# Patient Record
Sex: Male | Born: 1940 | Race: White | Hispanic: No | Marital: Married | Smoking: Never smoker
Health system: Southern US, Community
[De-identification: ages and names within clinical notes are randomized; demographics above are authoritative.]

## PROBLEM LIST (undated history)

## (undated) DIAGNOSIS — R011 Cardiac murmur, unspecified: Secondary | ICD-10-CM

## (undated) DIAGNOSIS — Z125 Encounter for screening for malignant neoplasm of prostate: Secondary | ICD-10-CM

## (undated) DIAGNOSIS — C801 Malignant (primary) neoplasm, unspecified: Secondary | ICD-10-CM

## (undated) DIAGNOSIS — D126 Benign neoplasm of colon, unspecified: Secondary | ICD-10-CM

## (undated) DIAGNOSIS — I712 Thoracic aortic aneurysm, without rupture: Secondary | ICD-10-CM

## (undated) DIAGNOSIS — I7121 Aneurysm of the ascending aorta, without rupture: Secondary | ICD-10-CM

## (undated) DIAGNOSIS — M67919 Unspecified disorder of synovium and tendon, unspecified shoulder: Secondary | ICD-10-CM

## (undated) DIAGNOSIS — M1990 Unspecified osteoarthritis, unspecified site: Secondary | ICD-10-CM

## (undated) DIAGNOSIS — R7401 Elevation of levels of liver transaminase levels: Secondary | ICD-10-CM

## (undated) DIAGNOSIS — R740 Nonspecific elevation of levels of transaminase and lactic acid dehydrogenase [LDH]: Secondary | ICD-10-CM

## (undated) DIAGNOSIS — E785 Hyperlipidemia, unspecified: Secondary | ICD-10-CM

## (undated) DIAGNOSIS — T8859XA Other complications of anesthesia, initial encounter: Secondary | ICD-10-CM

## (undated) DIAGNOSIS — I10 Essential (primary) hypertension: Secondary | ICD-10-CM

## (undated) DIAGNOSIS — J45909 Unspecified asthma, uncomplicated: Secondary | ICD-10-CM

## (undated) DIAGNOSIS — Z9889 Other specified postprocedural states: Secondary | ICD-10-CM

## (undated) DIAGNOSIS — T4145XA Adverse effect of unspecified anesthetic, initial encounter: Secondary | ICD-10-CM

## (undated) DIAGNOSIS — I351 Nonrheumatic aortic (valve) insufficiency: Secondary | ICD-10-CM

## (undated) DIAGNOSIS — Z860100 Personal history of colon polyps, unspecified: Secondary | ICD-10-CM

## (undated) DIAGNOSIS — R0609 Other forms of dyspnea: Secondary | ICD-10-CM

## (undated) DIAGNOSIS — R112 Nausea with vomiting, unspecified: Secondary | ICD-10-CM

## (undated) DIAGNOSIS — Z8719 Personal history of other diseases of the digestive system: Secondary | ICD-10-CM

## (undated) DIAGNOSIS — N529 Male erectile dysfunction, unspecified: Secondary | ICD-10-CM

## (undated) DIAGNOSIS — J019 Acute sinusitis, unspecified: Secondary | ICD-10-CM

## (undated) DIAGNOSIS — R0989 Other specified symptoms and signs involving the circulatory and respiratory systems: Secondary | ICD-10-CM

## (undated) DIAGNOSIS — E291 Testicular hypofunction: Secondary | ICD-10-CM

## (undated) DIAGNOSIS — R079 Chest pain, unspecified: Secondary | ICD-10-CM

## (undated) DIAGNOSIS — M719 Bursopathy, unspecified: Secondary | ICD-10-CM

## (undated) DIAGNOSIS — N400 Enlarged prostate without lower urinary tract symptoms: Secondary | ICD-10-CM

## (undated) HISTORY — DX: Chest pain, unspecified: R07.9

## (undated) HISTORY — DX: Personal history of colon polyps, unspecified: Z86.0100

## (undated) HISTORY — DX: Benign prostatic hyperplasia without lower urinary tract symptoms: N40.0

## (undated) HISTORY — DX: Acute sinusitis, unspecified: J01.90

## (undated) HISTORY — DX: Essential (primary) hypertension: I10

## (undated) HISTORY — PX: ORIF RADIAL SHAFT FRACTURE: SUR957

## (undated) HISTORY — DX: Benign neoplasm of colon, unspecified: D12.6

## (undated) HISTORY — DX: Thoracic aortic aneurysm, without rupture: I71.2

## (undated) HISTORY — DX: Other specified symptoms and signs involving the circulatory and respiratory systems: R09.89

## (undated) HISTORY — DX: Nonspecific elevation of levels of transaminase and lactic acid dehydrogenase (ldh): R74.0

## (undated) HISTORY — DX: Encounter for screening for malignant neoplasm of prostate: Z12.5

## (undated) HISTORY — DX: Unspecified asthma, uncomplicated: J45.909

## (undated) HISTORY — DX: Other forms of dyspnea: R06.09

## (undated) HISTORY — DX: Testicular hypofunction: E29.1

## (undated) HISTORY — PX: EYE SURGERY: SHX253

## (undated) HISTORY — DX: Bursopathy, unspecified: M71.9

## (undated) HISTORY — DX: Unspecified disorder of synovium and tendon, unspecified shoulder: M67.919

## (undated) HISTORY — DX: Hyperlipidemia, unspecified: E78.5

## (undated) HISTORY — DX: Male erectile dysfunction, unspecified: N52.9

## (undated) HISTORY — DX: Elevation of levels of liver transaminase levels: R74.01

## (undated) HISTORY — DX: Aneurysm of the ascending aorta, without rupture: I71.21

## (undated) HISTORY — DX: Nonrheumatic aortic (valve) insufficiency: I35.1

## (undated) HISTORY — DX: Personal history of colonic polyps: Z86.010

---

## 2002-03-20 ENCOUNTER — Ambulatory Visit (HOSPITAL_COMMUNITY): Admission: RE | Admit: 2002-03-20 | Discharge: 2002-03-20 | Payer: Self-pay | Admitting: *Deleted

## 2002-03-20 ENCOUNTER — Encounter: Payer: Self-pay | Admitting: *Deleted

## 2011-10-28 ENCOUNTER — Encounter: Payer: Self-pay | Admitting: Cardiothoracic Surgery

## 2011-10-28 ENCOUNTER — Institutional Professional Consult (permissible substitution) (INDEPENDENT_AMBULATORY_CARE_PROVIDER_SITE_OTHER): Payer: Self-pay | Admitting: Thoracic Surgery (Cardiothoracic Vascular Surgery)

## 2011-10-28 DIAGNOSIS — I359 Nonrheumatic aortic valve disorder, unspecified: Secondary | ICD-10-CM

## 2011-10-28 DIAGNOSIS — S46219A Strain of muscle, fascia and tendon of other parts of biceps, unspecified arm, initial encounter: Secondary | ICD-10-CM | POA: Insufficient documentation

## 2011-10-28 DIAGNOSIS — I351 Nonrheumatic aortic (valve) insufficiency: Secondary | ICD-10-CM

## 2011-10-28 DIAGNOSIS — I712 Thoracic aortic aneurysm, without rupture: Secondary | ICD-10-CM

## 2011-10-28 DIAGNOSIS — D126 Benign neoplasm of colon, unspecified: Secondary | ICD-10-CM

## 2011-10-28 DIAGNOSIS — K635 Polyp of colon: Secondary | ICD-10-CM | POA: Insufficient documentation

## 2011-10-28 DIAGNOSIS — S43499A Other sprain of unspecified shoulder joint, initial encounter: Secondary | ICD-10-CM

## 2011-10-28 DIAGNOSIS — N400 Enlarged prostate without lower urinary tract symptoms: Secondary | ICD-10-CM | POA: Insufficient documentation

## 2011-10-28 DIAGNOSIS — J45909 Unspecified asthma, uncomplicated: Secondary | ICD-10-CM | POA: Insufficient documentation

## 2011-10-28 DIAGNOSIS — E785 Hyperlipidemia, unspecified: Secondary | ICD-10-CM

## 2011-10-28 NOTE — Progress Notes (Signed)
PCP is No primary provider on file. Referring Provider is No ref. provider found  No chief complaint on file.   HPI: 71 yo WM in overall good health presents with cc/o aortic aneurysm. He first noted SOB while climbing ~ 40 steps on a hot day in summer of 2011. Went to MD. W/u included stress test, echocardiogram and Ct of chest. Found to have 4.8 x 4.6 cm ascending aortic aneurysm and mild to moderate AI. Was seen in consultation by Dr. Tereso Newcomer. Recommended f/u echo, which was done in 12/12. It showed no significant change with size estimated at 4.9 cm and mild to moderate AI. He is essentially asymptomatic, only getting SOB when walking up multiple fights of stairs. He can walk 25- 30 minutes on treadmill and lift weights without problems. He is very anxious about the aneurysm.   Past Medical History  Diagnosis Date  . Acute sinusitis, unspecified   . Abdominal aneurysm without mention of rupture     4.8cm x 4.6cm  . Personal history of colonic polyps     x1-has surveillance colonoscopies.Complete Colonoscopy;with polypectomy [phone]; has surveillance colonoscopies.   . Aortic valve disorders   . Unspecified asthma   . Benign neoplasm of colon   . 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 Chest pain, unspecified   . Snoring disorder   . Hypertension   . Hyperlipidemia   . Other testicular hypofunction   . Impotence of organic origin   . Nonspecific elevation of levels of transaminase or lactic acid dehydrogenase (LDH)   . Disorders of bursae and tendons in shoulder region, unspecified   . Special screening for malignant neoplasm of prostate     Past Surgical History  Procedure Date  . Orif radial shaft fracture     Clo Treat of Fracture of Radial Shaft, Distal Dislocation; bilateral-age 71 years    No family history on file.- negative for aneurysm  Social History History  Substance Use Topics  . Smoking status:  Never Smoker   . Smokeless tobacco: Not on file  . Alcohol Use: Yes     ocassional wine and beer    Current Outpatient Prescriptions  Medication Sig Dispense Refill  . albuterol (PROVENTIL,VENTOLIN) 90 MCG/ACT inhaler Inhale 1-2 puffs into the lungs every 4 (four) hours as needed.      Marland Kitchen aspirin 81 MG tablet Take 81 mg by mouth daily.      . Cholecalciferol (VITAMIN D PO) Take 400 Units by mouth daily.      . CYCLOBENZAPRINE HCL PO 10mg  tablet. Take 1/2 to 1 tablet at bedtime as needed for neck/shoulder pain      . ERTACZO 2 % CREA       . fenofibrate micronized (LOFIBRA) 67 MG capsule Take 67 mg by mouth daily.      Marland Kitchen LOVASTATIN PO Take 20 mg by mouth daily.      . Multiple Vitamin (MULTIVITAMIN) capsule Take 1 capsule by mouth daily.      . Multiple Vitamins-Minerals (ZINC PO) Take 1 tablet by mouth daily.      . Tamsulosin HCl (FLOMAX) 0.4 MG CAPS Take 0.4 mg by mouth at bedtime.        Allergies  Allergen Reactions  . Demerol     NAUSEA AND VOMITTING    Review of Systems  Constitutional: Negative for fever, chills, activity change, appetite change and unexpected weight change.  HENT: Negative.   Respiratory:  Positive for shortness of breath (when walking up > 2 flights of stairs) and wheezing (occasionally). Negative for apnea, cough and chest tightness.   Cardiovascular: Negative for chest pain and leg swelling.       No orthopnea or PND  Gastrointestinal: Negative.   Genitourinary: Positive for urgency and frequency.  Musculoskeletal:       Chronic torn right bicep  Skin: Negative.   Neurological: Negative for dizziness, syncope and light-headedness.  Hematological: Negative.   Psychiatric/Behavioral: Negative.   All other systems reviewed and are negative.    There were no vitals taken for this visit. Physical Exam  Constitutional: He is oriented to person, place, and time. He appears well-developed and well-nourished. No distress.  HENT:  Head: Normocephalic  and atraumatic.  Eyes: EOM are normal. Pupils are equal, round, and reactive to light.  Neck: Neck supple. No JVD present. No thyromegaly present.  Cardiovascular: Normal rate, regular rhythm and intact distal pulses.  Exam reveals no gallop and no friction rub.   Murmur (2/6 diastolic murmur) heard. Pulmonary/Chest: Effort normal. He has no wheezes. He has no rales.  Abdominal: Soft. There is no tenderness.  Musculoskeletal: Normal range of motion. He exhibits no edema.  Lymphadenopathy:    He has no cervical adenopathy.  Neurological: He is alert and oriented to person, place, and time.       No focal deficits  Skin: Skin is warm and dry.  Psychiatric: He has a normal mood and affect.     Diagnostic Tests: Echo reviewed.  Impression: 71 yo WM with a 4.8- 4.9 cm ascending aortic aneurysm with mild to moderate AI. It does not appear to have increased in size significantly since 8/11, but only recent measurement is by echo, not CT or MR. I had a long discussion with the patient and his daughter regarding the diagnosis, natural history of ascending aortic aneurysms and indications for treatment. They are aware of risks of dissection and rupture. They are aware that general recommendation for surgery for aneurysms at this location is 5.5 -6 cm or growth greater than 5 mm in 6 months.   He initially was very anxious to proceed with surgery, but following our conversation his anxiety about the aneurysm was improved.  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 Outside of heavy weight lifting he does not need to limit his activities at all.  Plan: Since it has been > 1 year since his last CT and there is some chance of small errors with echo measurements, I recommended we repeat a chest CT angio. I will see him back next week to discuss the results.

## 2011-11-04 ENCOUNTER — Ambulatory Visit (INDEPENDENT_AMBULATORY_CARE_PROVIDER_SITE_OTHER): Payer: BLUE CROSS/BLUE SHIELD | Admitting: Thoracic Surgery (Cardiothoracic Vascular Surgery)

## 2011-11-04 DIAGNOSIS — I712 Thoracic aortic aneurysm, without rupture: Secondary | ICD-10-CM

## 2011-11-04 NOTE — Progress Notes (Signed)
Patient ID: Levi Shepard, male   DOB: 07/01/1941, 71 y.o.   MRN: [id number]  Met with Levi Shepard today to review CT angio from 10/28/11 and compare to 05/13/10.  I reviewed the films in detail with him on the monitor, showing him measurements from coronal, sagittal and cross sectional views.  The radiologist officially read this as being increased in size from 4.8 x 5.1 to 4.8 by 5.6 or an increase of 5 mm in Ap dimension with no change in transverse dimension. With repeated measurements in all 3 views, which both studies had in common, I find there is no increase in the true diameter of the aneurysm, which measures 4.8 cm- 4.9 cm in both studies. It does appear the aneurysm may have elongated to some degree from the prior study with a slightly more pronounced C- shape to the ascending aorta, which may have contributed to the measurement obtained in the multiplanar view.  My recommendation to Levi Shepard at this time is to repeat imaging in 6 months with an MR angio. I do not think there is evidence of increased diameter sufficient to recommend aortic root replacement at this time.

## 2013-11-02 ENCOUNTER — Encounter (HOSPITAL_BASED_OUTPATIENT_CLINIC_OR_DEPARTMENT_OTHER): Payer: Self-pay | Admitting: Emergency Medicine

## 2013-11-02 ENCOUNTER — Emergency Department (HOSPITAL_BASED_OUTPATIENT_CLINIC_OR_DEPARTMENT_OTHER)
Admission: EM | Admit: 2013-11-02 | Discharge: 2013-11-02 | Disposition: A | Discharge status: Home / Self Care | Payer: Medicare Other | Attending: Emergency Medicine | Admitting: Emergency Medicine

## 2013-11-02 DIAGNOSIS — R05 Cough: Secondary | ICD-10-CM | POA: Insufficient documentation

## 2013-11-02 DIAGNOSIS — J45909 Unspecified asthma, uncomplicated: Secondary | ICD-10-CM | POA: Insufficient documentation

## 2013-11-02 DIAGNOSIS — E785 Hyperlipidemia, unspecified: Secondary | ICD-10-CM | POA: Insufficient documentation

## 2013-11-02 DIAGNOSIS — Z860100 Personal history of colon polyps, unspecified: Secondary | ICD-10-CM | POA: Insufficient documentation

## 2013-11-02 DIAGNOSIS — R42 Dizziness and giddiness: Secondary | ICD-10-CM | POA: Insufficient documentation

## 2013-11-02 DIAGNOSIS — Z7982 Long term (current) use of aspirin: Secondary | ICD-10-CM | POA: Insufficient documentation

## 2013-11-02 DIAGNOSIS — Z8546 Personal history of malignant neoplasm of prostate: Secondary | ICD-10-CM | POA: Insufficient documentation

## 2013-11-02 DIAGNOSIS — I10 Essential (primary) hypertension: Secondary | ICD-10-CM | POA: Insufficient documentation

## 2013-11-02 DIAGNOSIS — Z79899 Other long term (current) drug therapy: Secondary | ICD-10-CM | POA: Insufficient documentation

## 2013-11-02 DIAGNOSIS — N400 Enlarged prostate without lower urinary tract symptoms: Secondary | ICD-10-CM | POA: Insufficient documentation

## 2013-11-02 DIAGNOSIS — R059 Cough, unspecified: Secondary | ICD-10-CM | POA: Insufficient documentation

## 2013-11-02 DIAGNOSIS — R040 Epistaxis: Secondary | ICD-10-CM | POA: Diagnosis present

## 2013-11-02 MED ORDER — OXYMETAZOLINE HCL 0.05 % NA SOLN
1.0000 | Freq: Once | NASAL | Status: AC
  Administered 2013-11-02: 1 via NASAL
  Filled 2013-11-02: qty 15

## 2013-11-02 NOTE — ED Notes (Addendum)
Nosebleed tonight. He been having nosebleeds on and off for the past couple of days. Daughter is with him and states he had so much bleeding tonight he was coughing up blood. Bleeding has slowed on arrival to triage.

## 2013-11-02 NOTE — Discharge Instructions (Signed)

## 2013-11-02 NOTE — ED Provider Notes (Signed)
CSN: [id number]     Arrival date & time 11/02/13  1941 History  This chart was scribed for [name], MD by Maree Erie, ED Scribe. The patient was seen in room MH01/MH01. Patient's care was started at 9:07 PM.      Chief Complaint  Patient presents with  . Epistaxis    Patient is a 73 y.o. male presenting with nosebleeds. The history is provided by the patient. No language interpreter was used.  Epistaxis Location:  L nare Duration:  3 days Timing:  Intermittent Progression:  Worsening Chronicity:  New Context: aspirin use   Relieved by:  Applying pressure Associated symptoms: no cough, no dizziness and no fever     HPI Comments: Levi Shepard is a 73 y.o. male who presents to the Emergency Department complaining of intermittent epistaxis from his left nostril that began two days ago while he was in the shower. He states that he has been able to stop the bleeding with pressure except for tonight. The last episode of bleeding began two and a half hours ago and finally subsided upon arriving to the exam room in the ED. He states that it was bleeding so profusely that it was filling up his mouth and he was coughing out clots. He reports feeling mildly weak and dizzy while the bleeding was occuring that has since subsided. He denies a history of epistaxis that has been this severe before. He takes an 81 mg Aspirin everyday and reports taking it during the days he was having epistaxis. His daughter states he was recently placed on a beta blocker for a diagnosed 5 cm aortic aneurysm.    Past Medical History  Diagnosis Date  . Acute sinusitis, unspecified   . Abdominal aneurysm without mention of rupture     4.8cm x 4.6cm  . Personal history of colonic polyps     x1-has surveillance colonoscopies.Complete Colonoscopy;with polypectomy [phone]; has surveillance colonoscopies.   . Aortic valve disorders   . Unspecified asthma(493.90)   . Benign neoplasm of colon   . Hypertrophy  of prostate without urinary obstruction and other lower urinary tract symptoms (LUTS)   . Chest pain, unspecified   . Other dyspnea and respiratory abnormality   . Hypertension   . Hyperlipidemia   . Other testicular hypofunction   . Impotence of organic origin   . Nonspecific elevation of levels of transaminase or lactic acid dehydrogenase (LDH)   . Disorders of bursae and tendons in shoulder region, unspecified   . Special screening for malignant neoplasm of prostate    Past Surgical History  Procedure Laterality Date  . Orif radial shaft fracture      Clo Treat of Fracture of Radial Shaft, Distal Dislocation; bilateral-age 23 years   No family history on file. History  Substance Use Topics  . Smoking status: Never Smoker   . Smokeless tobacco: Not on file  . Alcohol Use: Yes     Comment: ocassional wine and beer    Review of Systems  Constitutional: Negative for fever.  HENT: Positive for nosebleeds. Negative for drooling.   Eyes: Negative for discharge.  Respiratory: Negative for cough.   Cardiovascular: Negative for leg swelling.  Gastrointestinal: Negative for vomiting.  Endocrine: Negative for polyuria.  Genitourinary: Negative for hematuria.  Musculoskeletal: Negative for gait problem.  Skin: Negative for rash.  Allergic/Immunologic: Negative for immunocompromised state.  Neurological: Negative for dizziness, speech difficulty and weakness.  Hematological: Negative for adenopathy.  Psychiatric/Behavioral: Negative for confusion.  All other systems reviewed and are negative.    Allergies  Demerol  Home Medications   Current Outpatient Rx  Name  Route  Sig  Dispense  Refill  . albuterol (PROVENTIL,VENTOLIN) 90 MCG/ACT inhaler   Inhalation   Inhale 1-2 puffs into the lungs every 4 (four) hours as needed.         Marland Kitchen aspirin 81 MG tablet   Oral   Take 81 mg by mouth daily.         . Cholecalciferol (VITAMIN D PO)   Oral   Take 400 Units by mouth  daily.         . CYCLOBENZAPRINE HCL PO      10mg  tablet. Take 1/2 to 1 tablet at bedtime as needed for neck/shoulder pain         . ERTACZO 2 % CREA               . fenofibrate micronized (LOFIBRA) 67 MG capsule   Oral   Take 67 mg by mouth daily.         Marland Kitchen LOVASTATIN PO   Oral   Take 20 mg by mouth daily.         . Multiple Vitamin (MULTIVITAMIN) capsule   Oral   Take 1 capsule by mouth daily.         . Multiple Vitamins-Minerals (ZINC PO)   Oral   Take 1 tablet by mouth daily.         . Tamsulosin HCl (FLOMAX) 0.4 MG CAPS   Oral   Take 0.4 mg by mouth at bedtime.          Triage Vitals: BP 122/71  Pulse 72  Temp(Src) 97.4 F (36.3 C) (Tympanic)  Resp 16  Ht 6' (1.829 m)  Wt 178 lb (80.74 kg)  BMI 24.14 kg/m2  SpO2 96%  Physical Exam  Nursing note and vitals reviewed. Constitutional: He is oriented to person, place, and time. He appears well-developed and well-nourished. No distress.  HENT:  Head: Normocephalic and atraumatic.  Right Ear: External ear normal.  Left Ear: External ear normal.  Mouth/Throat: Oropharynx is clear and moist. No oropharyngeal exudate.  Left anterior nare, medial septal wall there is a small hemostatic ulceration.   Eyes: Conjunctivae and EOM are normal. Pupils are equal, round, and reactive to light.  Neck: Neck supple. No tracheal deviation present.  Cardiovascular: Normal rate, regular rhythm and normal heart sounds.  Exam reveals no gallop and no friction rub.   No murmur heard. Pulmonary/Chest: Effort normal and breath sounds normal. No respiratory distress. He has no wheezes. He has no rales.  Abdominal: Soft. He exhibits no distension. There is no tenderness. There is no rebound and no guarding.  Musculoskeletal: Normal range of motion.  Neurological: He is alert and oriented to person, place, and time.  Skin: Skin is warm and dry.  Psychiatric: He has a normal mood and affect. His behavior is normal.     ED Course  Procedures (including critical care time)  DIAGNOSTIC STUDIES: Oxygen Saturation is 96% on room air, adequate by my interpretation.    COORDINATION OF CARE: 9:15 PM - Patient verbalizes understanding and agrees with treatment plan.    Labs Review Labs Reviewed - No data to display Imaging Review No results found.  EKG Interpretation   None       MDM   1. Anterior epistaxis    73 y.o. here w/ several episodes of epistaxis in  last few days. Currently hemostatic. 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 Will place bacitracin now and provide afrin. Gave instructions for recurrent bleeds. I offered screening cbc, but pt insists he feels fine now and declines.    I have discussed the diagnosis/risks/treatment options with the patient and believe the pt to be eligible for discharge home to follow-up with ENT if bleeding continues. We also discussed returning to the ED immediately if new or worsening sx occur. We discussed the sx which are most concerning (e.g., uncontrolled epistaxis, dizziness, sob, weakness) that necessitate immediate return. Medications administered to the patient during their visit and any new prescriptions provided to the patient are listed below.  Medications given during this visit Medications  oxymetazoline (AFRIN) 0.05 % nasal spray 1 spray (1 spray Each Nare Given 11/02/13 2131)    Discharge Medication List as of 11/02/2013  9:31 PM        I personally performed the services described in this documentation, which was scribed in my presence. The recorded information has been reviewed and is accurate.    [name], MD 11/03/13 [phone]

## 2014-12-19 DIAGNOSIS — M4317 Spondylolisthesis, lumbosacral region: Secondary | ICD-10-CM | POA: Insufficient documentation

## 2014-12-19 DIAGNOSIS — M545 Low back pain, unspecified: Secondary | ICD-10-CM | POA: Insufficient documentation

## 2014-12-19 DIAGNOSIS — M47817 Spondylosis without myelopathy or radiculopathy, lumbosacral region: Secondary | ICD-10-CM | POA: Insufficient documentation

## 2015-03-13 ENCOUNTER — Institutional Professional Consult (permissible substitution) (INDEPENDENT_AMBULATORY_CARE_PROVIDER_SITE_OTHER): Payer: Medicare Other | Admitting: Thoracic Surgery (Cardiothoracic Vascular Surgery)

## 2015-03-13 ENCOUNTER — Other Ambulatory Visit: Payer: Self-pay | Admitting: *Deleted

## 2015-03-13 ENCOUNTER — Encounter: Payer: Self-pay | Admitting: Thoracic Surgery (Cardiothoracic Vascular Surgery)

## 2015-03-13 VITALS — BP 139/80 | HR 77 | Resp 20 | Ht 72.0 in | Wt 188.0 lb

## 2015-03-13 DIAGNOSIS — I351 Nonrheumatic aortic (valve) insufficiency: Secondary | ICD-10-CM | POA: Diagnosis not present

## 2015-03-13 DIAGNOSIS — I712 Thoracic aortic aneurysm, without rupture: Secondary | ICD-10-CM

## 2015-03-13 DIAGNOSIS — I7121 Aneurysm of the ascending aorta, without rupture: Secondary | ICD-10-CM

## 2015-03-13 DIAGNOSIS — I10 Essential (primary) hypertension: Secondary | ICD-10-CM

## 2015-03-13 NOTE — Progress Notes (Signed)
[address]       [place] [address]             [phone]       HPI:  Mr. Baughman comes to the office today to reestablish management of his ascending aortic aneurysm with me.  This aneurysm was first noted in 2011. He experienced SOB while climbing ~ 40 steps on a hot day that summer. He had a work up which included a stress test, echocardiogram and CT of the chest. He was found to have 4.8 x 4.6 cm ascending aortic aneurysm and mild to moderate AI. He saw Dr. Lewayne Bunting, who recommended a f/u echo, which was done in December 2012. There was no significant change with size estimated at 4.9 cm and mild to moderate AI.  I saw him in January of 2013 and recommended an MR in 6 months. The MR showed no significant change. After that he was followed by [facility], but says he never saw the same MD twice. He had a CT angiogram in August of 2015. He thinks he had an echo last year as well.   He has now decided to move his care re: the aneurysm to [facility]  He remains active and only gets SOB with heavy exertion. He can walk a mile at a steady pace without issues. He denies chest pain, pressure or tightness. He has recently noted several episodes of paroxysmal nocturnal dyspnea. He denies orthopnea and peripheral edema.  Past Medical History  Diagnosis Date  . Acute sinusitis, unspecified   . Ascending aortic aneurysm     4.8cm x 4.6cm in 2013  . Personal history of colonic polyps     x1-has surveillance colonoscopies.Complete Colonoscopy;with polypectomy [phone]; has surveillance colonoscopies.   . Aortic valve insufficiency   . Unspecified asthma(493.90)   . Benign neoplasm of colon   . 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 Chest pain, unspecified   . Other dyspnea and respiratory abnormality   . Hypertension   . Hyperlipidemia   . Other testicular hypofunction   . Impotence of  organic origin   . Nonspecific elevation of levels of transaminase or lactic acid dehydrogenase (LDH)   . Disorders of bursae and tendons in shoulder region, unspecified   . Special screening for malignant neoplasm of prostate       Current Outpatient Prescriptions  Medication Sig Dispense Refill  . albuterol (PROVENTIL,VENTOLIN) 90 MCG/ACT inhaler Inhale 1-2 puffs into the lungs every 4 (four) hours as needed.    . Cholecalciferol (VITAMIN D PO) Take 400 Units by mouth daily.    . CYCLOBENZAPRINE HCL PO 10mg  tablet. Take 1/2 to 1 tablet at bedtime as needed for neck/shoulder pain    . ERTACZO 2 % CREA daily as needed.     . fenofibrate micronized (LOFIBRA) 67 MG capsule Take 67 mg by mouth daily.    Marland Kitchen LOVASTATIN PO Take 20 mg by mouth daily.    . Multiple Vitamin (MULTIVITAMIN) capsule Take 1 capsule by mouth daily.    . Multiple Vitamins-Minerals (ZINC PO) Take 1 tablet by mouth daily.    . Tamsulosin HCl (FLOMAX) 0.4 MG CAPS Take 0.4 mg by mouth at bedtime.     No current facility-administered medications for this visit.    Physical Exam BP 139/80 mmHg  Pulse 77  Resp 20  Ht 6' (1.829 m)  Wt 188 lb (85.276 kg)  BMI 25.49 kg/m2  SpO2 97%  74 man in NAD Well developed and well nourished HEENT- [place]/AT, PERRLA, EOMI Neuro- A and O x 3, motor and sensory intact Neck- supple, no thyromegaly or adenopathy. No carotid bruits Cardiac: RRR, 3/6 diastolic murmur with early systolic component Lungs- clear with equal BS bilaterally, no rales or wheezes Abdomen- soft, NT/ND Extremities- no clubbing, cyanosis or edema Vascular- 3+ radial, DP and PT pulses equal bilaterally. No popliteal aneurysms  Diagnostic Tests: I personally reviewed his CT scans from 2011, 2013 and August 2015. He has a large aneurysm involving the aortic root and ascending aorta. Maximum size is 5.2 cm  Impression:  74 yo man with a 5.2 cm (as of 05/2014) ascending aortic aneurysm and known mild- moderate  AI.  He is not having any chest pain or exertional dyspnea, but has recently started having PND. He has no other signs or symptoms of heart failure.  Given the size of the aneurysm I think he needs a CT every 6 months. It has been almost 10 months since his last scan so I will order a new one and plan to see him back next week.  He is having some PND. He has known AI and his murmur is relatively loud on exam today. I will get a copy of the report from his most recent echo. If > 6 months old, he will need a repeat of that as well.  He knows to call 911 if he experiences chest pain, pressure or tightness.  Plan:  CT angiogram Echo report Follow up in one week to discuss surgery v continued conservative management  Melrose Nakayama, MD [facility] [phone]

## 2015-03-20 ENCOUNTER — Ambulatory Visit
Admission: RE | Admit: 2015-03-20 | Discharge: 2015-03-20 | Disposition: A | Discharge status: Home / Self Care | Payer: Medicare Other | Source: Ambulatory Visit | Attending: Thoracic Surgery (Cardiothoracic Vascular Surgery) | Admitting: Thoracic Surgery (Cardiothoracic Vascular Surgery)

## 2015-03-20 DIAGNOSIS — I712 Thoracic aortic aneurysm, without rupture: Secondary | ICD-10-CM

## 2015-03-20 DIAGNOSIS — I7121 Aneurysm of the ascending aorta, without rupture: Secondary | ICD-10-CM

## 2015-03-20 LAB — BUN: BUN: 27 mg/dL — ABNORMAL HIGH (ref 6–23)

## 2015-03-20 LAB — CREATININE, SERUM: Creat: 1.38 mg/dL — ABNORMAL HIGH (ref 0.50–1.35)

## 2015-03-20 MED ORDER — IOPAMIDOL (ISOVUE-370) INJECTION 76%
75.0000 mL | Freq: Once | INTRAVENOUS | Status: AC | PRN
  Administered 2015-03-20: 75 mL via INTRAVENOUS

## 2015-03-26 ENCOUNTER — Encounter: Payer: Self-pay | Admitting: Thoracic Surgery (Cardiothoracic Vascular Surgery)

## 2015-03-26 ENCOUNTER — Ambulatory Visit (INDEPENDENT_AMBULATORY_CARE_PROVIDER_SITE_OTHER): Payer: Medicare Other | Admitting: Thoracic Surgery (Cardiothoracic Vascular Surgery)

## 2015-03-26 VITALS — BP 150/76 | HR 72 | Resp 20 | Ht 72.0 in | Wt 191.0 lb

## 2015-03-26 DIAGNOSIS — I7121 Aneurysm of the ascending aorta, without rupture: Secondary | ICD-10-CM

## 2015-03-26 DIAGNOSIS — I712 Thoracic aortic aneurysm, without rupture: Secondary | ICD-10-CM | POA: Diagnosis not present

## 2015-03-26 NOTE — Progress Notes (Signed)
[address]       [place] [address]             [phone]       HPI:  Mr. Chenault returns today for follow-up after having a repeat CT angiogram of the chest.  He is a 74 year old gentleman who was first found to have an a ascending and aortic root aneurysm in 2011. The aneurysm was 4.8 x 4.6 cm and he had mild to moderate AI as well.  I saw him in January of 2013 and recommended an MR in 6 months. The MR showed no significant change. After that he was followed by [facility], but says he never saw the same MD twice. He had a CT angiogram in August of 2015. He decided to come to [facility] for follow-up of the aneurysm.  I saw him last week and recommended that we repeat his CT angiogram as it had been nearly a year since his most recent study. He had that done last week and now returns to discuss the results.  He remains active and only gets SOB with heavy exertion. He can walk a mile at a steady pace without issues. He denies chest pain, pressure or tightness. He has recently noted several episodes of paroxysmal nocturnal dyspnea. He denies orthopnea and peripheral edema.  Past Medical History  Diagnosis Date  . Acute sinusitis, unspecified   . Ascending aortic aneurysm     4.8cm x 4.6cm in 2013  . Personal history of colonic polyps     x1-has surveillance colonoscopies.Complete Colonoscopy;with polypectomy [phone]; has surveillance colonoscopies.   . Aortic valve insufficiency   . Unspecified asthma(493.90)   . Benign neoplasm of colon   . 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 Chest pain, unspecified   . Other dyspnea and respiratory abnormality   . Hypertension   . Hyperlipidemia   . Other testicular hypofunction   . Impotence of organic origin   . Nonspecific elevation of levels of transaminase or lactic acid dehydrogenase (LDH)   . Disorders of bursae and tendons in  shoulder region, unspecified   . Special screening for malignant neoplasm of prostate     Past Surgical History  Procedure Laterality Date  . Orif radial shaft fracture      Clo Treat of Fracture of Radial Shaft, Distal Dislocation; bilateral-age 63 years    Current Outpatient Prescriptions  Medication Sig Dispense Refill  . albuterol (PROVENTIL,VENTOLIN) 90 MCG/ACT inhaler Inhale 1-2 puffs into the lungs every 4 (four) hours as needed.    . Cholecalciferol (VITAMIN D PO) Take 400 Units by mouth daily.    . CYCLOBENZAPRINE HCL PO 10mg  tablet. Take 1/2 to 1 tablet at bedtime as needed for neck/shoulder pain    . ERTACZO 2 % CREA daily as needed.     . fenofibrate micronized (LOFIBRA) 67 MG capsule Take 67 mg by mouth daily.    Marland Kitchen LOVASTATIN PO Take 20 mg by mouth daily.    . Multiple Vitamin (MULTIVITAMIN) capsule Take 1 capsule by mouth daily.    . Multiple Vitamins-Minerals (ZINC PO) Take 1 tablet by mouth daily.    . Tamsulosin HCl (FLOMAX) 0.4 MG CAPS Take 0.4 mg by mouth at bedtime.     No current facility-administered medications for this visit.    Physical Exam BP 150/76 mmHg  Pulse 72  Resp 20  Ht 6' (1.829 m)  Wt 191 lb (86.637 kg)  BMI 25.90 kg/m2  SpO63 87% 74 year old man in no acute distress Well-developed and well-nourished HEENT unremarkable Alert and oriented 3 with no focal neurologic deficits No carotid bruits Cardiac regular rate and rhythm normal S1 and S2 with a diastolic murmur Lungs clear with equal breath sounds bilaterally Abdomen soft and nontender Extremities 2+ pulses throughout bilaterally, no edema  Diagnostic Tests: CT ANGIOGRAPHY CHEST WITH CONTRAST  TECHNIQUE: Multidetector CT imaging of the chest was performed using the standard protocol during bolus administration of intravenous contrast. Multiplanar CT image reconstructions and MIPs were obtained to evaluate the vascular anatomy.  CONTRAST: 75 mL of Isovue 370  intravenously.  COMPARISON: CT scan of May 23, 2014.  FINDINGS: No pneumothorax or pleural effusion is noted. Coronary artery calcifications are again noted. Stable cardiomegaly is noted. Stable left renal cyst is noted ; otherwise visualized portion of upper abdomen appears normal. There is no evidence of thoracic aortic dissection. Great vessels are widely patent without significant stenosis.  Sino-tubular junction of ascending thoracic aorta measures 5.2 cm in diameter which is unchanged compared to prior exam. Diameter at aortic valve measures 2.8 cm which is unchanged compared to prior exam. At the sinuses of Valsalva, maximum measured diameter 5.1 cm is noted which is increased compared to prior exam. Diameter just above the sino-tubular junction measures 5.6 cm which is increased compared to prior exam. Diameter of distal ascending thoracic aorta is 4.2 cm which is increased compared to prior exam. Aortic arch measures 3.5 cm in diameter which is slightly increased compared to prior exam. Proximal portion of descending thoracic aorta measures 4.0 cm which is increased compared to prior exam. Distal portion of descending thoracic aorta measures 3 cm 3.0 cm which is not significantly changed compared to prior exam.  No acute pulmonary disease is noted. No mediastinal mass or adenopathy is noted.  Review of the MIP images confirms the above findings.  IMPRESSION: Mild aneurysmal dilatation of the ascending thoracic aorta is noted, measuring 5.6 cm just above the sino-tubular junction which is increased compared to prior exam. Cardiothoracic surgery consultation recommended due to increased risk of rupture for arch aneurysm ? 5.5 cm. This recommendation follows 2010 ACCF/AHA/AATS/ACR/ASA/SCA/SCAI/SIR/STS/SVM Guidelines for the Diagnosis and Management of Patients With Thoracic Aortic Disease. Circulation. 2010; 121: F026-V785   Electronically Signed  By:  Marijo Conception, M.D.  On: 03/20/2015 12:57  Impression: Mr. Sakata is a 74 year old gentleman with an ascending aortic aneurysm including the aortic root. I reviewed the CT angiogram personally and concur with the radiologist's findings. This aneurysm has grown slightly over the past 8 or 9 months and now measures maximally 5.6 cm.  I had a long discussion with Mr. Mcisaac and reviewed the CT films with him. My recommendation is to go ahead and fix the aneurysm at this time. We discussed possible valve repair versus replacement. I also discussed the relative advantages and disadvantages of mechanical versus tissue valves. Given his age if valve replacement is necessary I think a tissue valve is a better option. He understands the trade off of longevity versus need for lifelong anticoagulation.  The proposed procedure is median sternotomy, repair of ascending aortic and aortic root aneurysm (Bentall procedure). I described the general nature of the operation to him including the need for general anesthesia, the incisions to be used, the possibility of the need for deep hypothermic circulatory arrest, the use of cardiopulmonary bypass, the expected hospital stay, and the overall recovery. I reviewed  the indications, risks, benefits, and alternatives. He understands that the risks include, but are not limited to death, stroke, MI, DVT, PE, bleeding, need for transfusion, infection, heart block requiring pacemaker, respiratory or renal failure, gastrointestinal complications, as well as the possibility of unforeseeable complications.   He needs cardiac catheterization and an echocardiogram prior to surgery. He no longer wishes to go to a cardiologist in [facility], so I will arrange for him to be seen by cardiology here.  Plan: Cardiology consult Echocardiogram Cardiac catheterization Bentall procedure I will see him back after his cardiology evaluation to review the results of those studies and  schedule surgery  Melrose Nakayama, MD [facility] [phone]  I spent 20 minutes face-to-face with Mr. Lutze during this visit

## 2015-03-27 ENCOUNTER — Encounter: Payer: Self-pay | Admitting: *Deleted

## 2015-03-27 ENCOUNTER — Telehealth: Payer: Self-pay | Admitting: *Deleted

## 2015-03-27 NOTE — Telephone Encounter (Signed)
TCS, called for fx hx/status, unable to reach pt.Marland KitchenMarland Kitchen

## 2015-03-28 ENCOUNTER — Encounter: Payer: Self-pay | Admitting: Cardiology

## 2015-03-28 ENCOUNTER — Ambulatory Visit (INDEPENDENT_AMBULATORY_CARE_PROVIDER_SITE_OTHER): Payer: Medicare Other | Admitting: Cardiology

## 2015-03-28 VITALS — BP 132/78 | HR 64 | Ht 72.0 in | Wt 191.0 lb

## 2015-03-28 DIAGNOSIS — E785 Hyperlipidemia, unspecified: Secondary | ICD-10-CM

## 2015-03-28 DIAGNOSIS — I351 Nonrheumatic aortic (valve) insufficiency: Secondary | ICD-10-CM | POA: Diagnosis not present

## 2015-03-28 DIAGNOSIS — I10 Essential (primary) hypertension: Secondary | ICD-10-CM

## 2015-03-28 DIAGNOSIS — I712 Thoracic aortic aneurysm, without rupture: Secondary | ICD-10-CM

## 2015-03-28 DIAGNOSIS — I359 Nonrheumatic aortic valve disorder, unspecified: Secondary | ICD-10-CM

## 2015-03-28 DIAGNOSIS — I77810 Thoracic aortic ectasia: Secondary | ICD-10-CM | POA: Diagnosis not present

## 2015-03-28 DIAGNOSIS — I7121 Aneurysm of the ascending aorta, without rupture: Secondary | ICD-10-CM

## 2015-03-28 LAB — CBC WITH DIFFERENTIAL/PLATELET
Basophils Absolute: 0 10*3/uL (ref 0.0–0.1)
Basophils Relative: 0.6 % (ref 0.0–3.0)
Eosinophils Absolute: 0.2 10*3/uL (ref 0.0–0.7)
Eosinophils Relative: 2.8 % (ref 0.0–5.0)
HCT: 50.4 % (ref 39.0–52.0)
Hemoglobin: 16.9 g/dL (ref 13.0–17.0)
Lymphocytes Relative: 34 % (ref 12.0–46.0)
Lymphs Abs: 2.3 10*3/uL (ref 0.7–4.0)
MCHC: 33.5 g/dL (ref 30.0–36.0)
MCV: 93.1 fl (ref 78.0–100.0)
Monocytes Absolute: 0.7 10*3/uL (ref 0.1–1.0)
Monocytes Relative: 10.5 % (ref 3.0–12.0)
Neutro Abs: 3.6 10*3/uL (ref 1.4–7.7)
Neutrophils Relative %: 52.1 % (ref 43.0–77.0)
Platelets: 244 10*3/uL (ref 150.0–400.0)
RBC: 5.42 Mil/uL (ref 4.22–5.81)
RDW: 13.2 % (ref 11.5–15.5)
WBC: 6.8 10*3/uL (ref 4.0–10.5)

## 2015-03-28 LAB — BASIC METABOLIC PANEL
BUN: 23 mg/dL (ref 6–23)
CO2: 25 mEq/L (ref 19–32)
Calcium: 10.3 mg/dL (ref 8.4–10.5)
Chloride: 104 mEq/L (ref 96–112)
Creatinine, Ser: 1.32 mg/dL (ref 0.40–1.50)
GFR: 56.61 mL/min — ABNORMAL LOW (ref >60.00)
Glucose, Bld: 89 mg/dL (ref 70–99)
Potassium: 4 mEq/L (ref 3.5–5.1)
Sodium: 136 mEq/L (ref 135–145)

## 2015-03-28 LAB — PROTIME-INR
INR: 1.1 ratio — ABNORMAL HIGH (ref 0.8–1.0)
Prothrombin Time: 11.7 s (ref 9.6–13.1)

## 2015-03-28 MED ORDER — ASPIRIN EC 81 MG PO TBEC: 81.0000 mg | DELAYED_RELEASE_TABLET | Freq: Every day | ORAL | Status: AC

## 2015-03-28 NOTE — Progress Notes (Signed)
Patient ID: Levi Shepard, male   DOB: March 30, 1941, 74 y.o.   MRN: [id number]      Cardiology Office Note  Date:  03/28/2015   ID:  Levi Shepard August 15, 1941, MRN [id number]  PCP:  Myrtis Hopping, MD  Cardiologist:  Dorothy Spark, MD   Chief complain: Establish cardiology care (quit [facility])   History of Present Illness: ARLYN Shepard is a 74 y.o. male who presents for evaluation of AI, aortic root and ascending aortic aneurysm. The patient was first diagnosed in 2011 when he developed sign od DOE and underwent stress test, that was negative. He was found to have mild to moderate AI and dilated aortic root and ascending aorta - measuring 5.6 x 4.8 cm.  He was seen by Dr Roxan Hockey in the past.  The most recent TTE done in 2014 showed dilated LVEDD 6.5 cm, LVEF 60-65%, mildly dilated left atrium, normal RVSP.  He is scheduled to see Dr Roxan Hockey on 04/09/2015.  He is very active and denies any chest pain, but has stable DOE - with moderate exercise, he jogs about 1 mile 3x/week. No palpitations or syncope.  He has no LE edema, but has noticed PND on few occasions in the last few months.   Past Medical History  Diagnosis Date  . Acute sinusitis, unspecified   . Ascending aortic aneurysm     4.8cm x 4.6cm in 2013  . Personal history of colonic polyps     x1-has surveillance colonoscopies.Complete Colonoscopy;with polypectomy [phone]; has surveillance colonoscopies.   . Aortic valve insufficiency   . Unspecified asthma(493.90)   . Benign neoplasm of colon   . 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 Chest pain, unspecified   . Other dyspnea and respiratory abnormality   . Hypertension   . Hyperlipidemia   . Other testicular hypofunction   . Impotence of organic origin   . Nonspecific elevation of levels of transaminase or lactic acid dehydrogenase (LDH)   . Disorders of bursae and tendons in  shoulder region, unspecified   . Special screening for malignant neoplasm of prostate     Past Surgical History  Procedure Laterality Date  . Orif radial shaft fracture      Clo Treat of Fracture of Radial Shaft, Distal Dislocation; bilateral-age 37 years     Current Outpatient Prescriptions  Medication Sig Dispense Refill  . albuterol (PROVENTIL,VENTOLIN) 90 MCG/ACT inhaler Inhale 1-2 puffs into the lungs every 4 (four) hours as needed.    Marland Kitchen aspirin EC 81 MG tablet Take 1 tablet (81 mg total) by mouth daily.    . Cholecalciferol (VITAMIN D PO) Take 400 Units by mouth daily.    . CYCLOBENZAPRINE HCL PO 10mg  tablet. Take 1/2 to 1 tablet at bedtime as needed for neck/shoulder pain    . ERTACZO 2 % CREA daily as needed.     . fenofibrate micronized (LOFIBRA) 67 MG capsule Take 67 mg by mouth daily.    Marland Kitchen LOVASTATIN PO Take 20 mg by mouth daily.    . Multiple Vitamin (MULTIVITAMIN) capsule Take 1 capsule by mouth daily.    . Multiple Vitamins-Minerals (ZINC PO) Take 1 tablet by mouth daily.    . Tamsulosin HCl (FLOMAX) 0.4 MG CAPS Take 0.4 mg by mouth at bedtime.     No current facility-administered medications for this visit.    Allergies:   Demerol and Morphine    Social History:  The patient  reports that he has never smoked. He does not have any smokeless tobacco history on file. He reports that he drinks alcohol. He reports that he does not use illicit drugs.   Family History:  The patient's family history is negative for Anemia, Arrhythmia, Asthma, Fainting, Clotting disorder, Heart attack, Heart disease, Heart failure, Hyperlipidemia, and Hypertension.    ROS:  Please see the history of present illness.   Otherwise, review of systems are positive for none.   All other systems are reviewed and negative.    PHYSICAL EXAM: VS:  BP 132/78 mmHg  Pulse 64  Ht 6' (1.829 m)  Wt 191 lb (86.637 kg)  BMI 25.90 kg/m2 , BMI Body mass index is 25.9 kg/(m^2). GEN: Well nourished, well  developed, in no acute distress HEENT: normal Neck: no JVD, carotid bruits, or masses Cardiac: RRR; 3/6 diastolic murmur at the LUSB, rubs, or gallops,no edema  Respiratory:  clear to auscultation bilaterally, normal work of breathing GI: soft, nontender, nondistended, + BS MS: no deformity or atrophy Skin: warm and dry, no rash Neuro:  Strength and sensation are intact Psych: euthymic mood, full affect  EKG:  EKG is not ordered today.  Recent Labs: 03/19/2015: BUN 27*; Creat 1.38*   Lipid Panel No results found for: CHOL, TRIG, HDL, CHOLHDL, VLDL, LDLCALC, LDLDIRECT   Wt Readings from Last 3 Encounters:  03/28/15 191 lb (86.637 kg)  03/26/15 191 lb (86.637 kg)  03/13/15 188 lb (85.276 kg)    CT chest 6:15/2016 Mild aneurysmal dilatation of the ascending thoracic aorta is noted, measuring 5.6 cm just above the sino-tubular junction which is increased compared to prior exam. Cardiothoracic surgery consultation recommended due to increased risk of rupture for arch aneurysm ? 5.5 cm.   Other studies Reviewed: Additional studies/ records that were reviewed today include: as per HPI    ASSESSMENT AND PLAN:  74 year old male   1. Aortic root dilatation, ascending aortic aneurysm (5.6 cm), aortic regurgitation - the patient is scheduled to see Dr Roxan Hockey for the consideration of AVR and aortic rot replacement  - we will order echocardiogram to re-evaluate LV size and function as well as RVSP - we will schedule a left cardiac cath to evaluate coronaries - the patient is advised to avoid heavy lifting and isometric exercise - add aspirin 81 mg po  - schedule pre cath labs - BMP, CBC, INR  2. Hypertension - well controlled  3. Hyperlipidemia - on lovastatin   Labs/ tests ordered today include:   Orders Placed This Encounter  Procedures  . Basic metabolic panel  . CBC w/Diff  . INR/PT  . EKG 12-Lead  . Echocardiogram   Disposition:   FU with Ranyia Witting H in  3 month.  Signed, Dorothy Spark, MD  03/28/2015 1:07 PM    [facility] [place], [place], [place]  [address] Phone: [phone]; Fax: [phone]

## 2015-03-28 NOTE — Patient Instructions (Signed)
Medication Instructions: START Aspirin 81 mg daily.  Labwork: LABS Today bmet, cbc, pt/INR  Testing/Procedures: Your physician has requested that you have an echocardiogram. Echocardiography is a painless test that uses sound waves to create images of your heart. It provides your doctor with information about the size and shape of your heart and how well your heart's chambers and valves are working. This procedure takes approximately one hour. There are no restrictions for this procedure.  Your physician has requested that you have a cardiac catheterization. Cardiac catheterization is used to diagnose and/or treat various heart conditions. Doctors may recommend this procedure for a number of different reasons. The most common reason is to evaluate chest pain. Chest pain can be a symptom of coronary artery disease (CAD), and cardiac catheterization can show whether plaque is narrowing or blocking your heart's arteries. This procedure is also used to evaluate the valves, as well as measure the blood flow and oxygen levels in different parts of your heart. For further information please visit [employer]. Please follow instruction sheet, as given.    Follow-Up Your physician recommends that you schedule a follow-up appointment in: 3 months with Dr. Meda Coffee   Any Other Special Instructions Will Be Listed Below (If Applicable).

## 2015-03-29 ENCOUNTER — Telehealth: Payer: Self-pay | Admitting: *Deleted

## 2015-03-29 NOTE — Telephone Encounter (Signed)
-----   Message from Dorothy Spark, MD sent at 03/29/2015  5:27 PM EDT ----- Mildly impaired Crea, he should get hydration prior to the cath.

## 2015-03-29 NOTE — Telephone Encounter (Signed)
Contacted the pt to inform him that per Dr Meda Coffee he has mildly impaired creatinine, and will require hydration prior to his scheduled cath on 04/05/15.  Informed the pt that I will need to obtain a clarification from Dr Meda Coffee on orders for hydration and what time the pt should arrive at the hospital to receive his hydration prior to his cath.  Informed the pt also there will need to be a call placed to the cath lab to inform on hydration orders and what time the pt is to arrive to the hospital prior too his procedure, after Dr Meda Coffee clarifies this.  Informed the pt that I will be out of the office on Monday 6/27, but will route this message to our triage pool to follow-up with clarification order for hydration prior to cath and call the cath lab and pt thereafter.  Pt verbalized understanding and agrees with this plan.

## 2015-04-01 ENCOUNTER — Other Ambulatory Visit: Payer: Self-pay

## 2015-04-01 ENCOUNTER — Ambulatory Visit (HOSPITAL_COMMUNITY): Payer: Medicare Other | Attending: Cardiology

## 2015-04-01 DIAGNOSIS — I340 Nonrheumatic mitral (valve) insufficiency: Secondary | ICD-10-CM | POA: Diagnosis not present

## 2015-04-01 DIAGNOSIS — I359 Nonrheumatic aortic valve disorder, unspecified: Secondary | ICD-10-CM | POA: Diagnosis not present

## 2015-04-01 DIAGNOSIS — I440 Atrioventricular block, first degree: Secondary | ICD-10-CM | POA: Diagnosis not present

## 2015-04-01 DIAGNOSIS — I358 Other nonrheumatic aortic valve disorders: Secondary | ICD-10-CM | POA: Diagnosis present

## 2015-04-01 DIAGNOSIS — I517 Cardiomegaly: Secondary | ICD-10-CM | POA: Insufficient documentation

## 2015-04-01 NOTE — Addendum Note (Signed)
Addended by: Dorothy Spark on: 04/01/2015 11:42 AM   Modules accepted: Orders

## 2015-04-02 NOTE — Telephone Encounter (Signed)
Ivy, I already placed the orders for bicarb drip prior to his cath.

## 2015-04-02 NOTE — Telephone Encounter (Signed)
Contacted the pt to inform him that he should arrive at [facility] (short stay) at [place] on 04/05/15 for scheduled cardiac cath with Dr Burt Knack, and needing hydration prior to cath.  Informed the pt that he will need to arrive at 0630 the day of his cath to receive hydration and ordered bicarb drip placed in the system per Dr Meda Coffee.  [place] cath lab scheduler aware that orders for bicarb drip and hydration needed prior to pts cath on 7/1.  Per [place] the pts new scheduled cath time will be at 1030 with Dr Burt Knack and he will need to arrive at [facility] for hydration therapy.  Pt verbalized understanding and agrees with this plan.

## 2015-04-05 ENCOUNTER — Ambulatory Visit (HOSPITAL_COMMUNITY)
Admission: RE | Admit: 2015-04-05 | Discharge: 2015-04-05 | Disposition: A | Discharge status: Home / Self Care | Payer: Medicare Other | Source: Ambulatory Visit | Attending: Cardiovascular Disease | Admitting: Cardiovascular Disease

## 2015-04-05 ENCOUNTER — Encounter (HOSPITAL_COMMUNITY)
Admission: RE | Disposition: A | Discharge status: Home / Self Care | Payer: Medicare Other | Source: Ambulatory Visit | Attending: Cardiovascular Disease

## 2015-04-05 DIAGNOSIS — I10 Essential (primary) hypertension: Secondary | ICD-10-CM | POA: Insufficient documentation

## 2015-04-05 DIAGNOSIS — Z79899 Other long term (current) drug therapy: Secondary | ICD-10-CM | POA: Insufficient documentation

## 2015-04-05 DIAGNOSIS — I2510 Atherosclerotic heart disease of native coronary artery without angina pectoris: Secondary | ICD-10-CM | POA: Diagnosis not present

## 2015-04-05 DIAGNOSIS — I359 Nonrheumatic aortic valve disorder, unspecified: Secondary | ICD-10-CM | POA: Insufficient documentation

## 2015-04-05 DIAGNOSIS — E785 Hyperlipidemia, unspecified: Secondary | ICD-10-CM | POA: Insufficient documentation

## 2015-04-05 DIAGNOSIS — J45909 Unspecified asthma, uncomplicated: Secondary | ICD-10-CM | POA: Diagnosis not present

## 2015-04-05 DIAGNOSIS — I351 Nonrheumatic aortic (valve) insufficiency: Secondary | ICD-10-CM | POA: Diagnosis not present

## 2015-04-05 DIAGNOSIS — Z7982 Long term (current) use of aspirin: Secondary | ICD-10-CM | POA: Diagnosis not present

## 2015-04-05 DIAGNOSIS — I7121 Aneurysm of the ascending aorta, without rupture: Secondary | ICD-10-CM | POA: Diagnosis present

## 2015-04-05 DIAGNOSIS — I712 Thoracic aortic aneurysm, without rupture: Secondary | ICD-10-CM | POA: Diagnosis not present

## 2015-04-05 HISTORY — PX: CARDIAC CATHETERIZATION: SHX172

## 2015-04-05 LAB — POCT I-STAT 3, VENOUS BLOOD GAS (G3P V)
ACID-BASE DEFICIT: 1 mmol/L (ref 0.0–2.0)
BICARBONATE: 23.5 meq/L (ref 20.0–24.0)
O2 SAT: 81 %
TCO2: 25 mmol/L (ref 0–100)
pCO2, Ven: 39 mmHg — ABNORMAL LOW (ref 45.0–50.0)
pH, Ven: 7.387 — ABNORMAL HIGH (ref 7.250–7.300)
pO2, Ven: 45 mmHg (ref 30.0–45.0)

## 2015-04-05 LAB — POCT I-STAT 3, ART BLOOD GAS (G3+)
Bicarbonate: 24.2 mEq/L — ABNORMAL HIGH (ref 20.0–24.0)
O2 SAT: 97 %
PCO2 ART: 38.6 mmHg (ref 35.0–45.0)
TCO2: 25 mmol/L (ref 0–100)
pH, Arterial: 7.406 (ref 7.350–7.450)
pO2, Arterial: 90 mmHg (ref 80.0–100.0)

## 2015-04-05 SURGERY — RIGHT/LEFT HEART CATH AND CORONARY ANGIOGRAPHY

## 2015-04-05 MED ORDER — MIDAZOLAM HCL 2 MG/2ML IJ SOLN
INTRAMUSCULAR | Status: AC
  Filled 2015-04-05: qty 2

## 2015-04-05 MED ORDER — LIDOCAINE HCL (PF) 1 % IJ SOLN
INTRAMUSCULAR | Status: DC | PRN
  Administered 2015-04-05: 5 mL via INTRADERMAL

## 2015-04-05 MED ORDER — SODIUM BICARBONATE 8.4 % IV SOLN
INTRAVENOUS | Status: AC
  Administered 2015-04-05: 07:00:00 via INTRAVENOUS
  Filled 2015-04-05: qty 150

## 2015-04-05 MED ORDER — FENTANYL CITRATE (PF) 100 MCG/2ML IJ SOLN
INTRAMUSCULAR | Status: AC
  Filled 2015-04-05: qty 2

## 2015-04-05 MED ORDER — ACETAMINOPHEN 325 MG PO TABS: 650.0000 mg | ORAL_TABLET | ORAL | Status: DC | PRN

## 2015-04-05 MED ORDER — ONDANSETRON HCL 4 MG/2ML IJ SOLN: 4.0000 mg | Freq: Four times a day (QID) | INTRAMUSCULAR | Status: DC | PRN

## 2015-04-05 MED ORDER — ASPIRIN 81 MG PO CHEW: 81.0000 mg | CHEWABLE_TABLET | ORAL | Status: DC

## 2015-04-05 MED ORDER — HEPARIN (PORCINE) IN NACL 2-0.9 UNIT/ML-% IJ SOLN
INTRAMUSCULAR | Status: AC
  Filled 2015-04-05: qty 1000

## 2015-04-05 MED ORDER — SODIUM CHLORIDE 0.9 % IJ SOLN: 3.0000 mL | Freq: Two times a day (BID) | INTRAMUSCULAR | Status: DC

## 2015-04-05 MED ORDER — HEPARIN SODIUM (PORCINE) 1000 UNIT/ML IJ SOLN
INTRAMUSCULAR | Status: AC
  Filled 2015-04-05: qty 1

## 2015-04-05 MED ORDER — VERAPAMIL HCL 2.5 MG/ML IV SOLN
INTRAVENOUS | Status: AC
  Filled 2015-04-05: qty 2

## 2015-04-05 MED ORDER — FENTANYL CITRATE (PF) 100 MCG/2ML IJ SOLN
INTRAMUSCULAR | Status: DC | PRN
  Administered 2015-04-05: 25 ug via INTRAVENOUS

## 2015-04-05 MED ORDER — MIDAZOLAM HCL 2 MG/2ML IJ SOLN
INTRAMUSCULAR | Status: DC | PRN
  Administered 2015-04-05: 2 mg via INTRAVENOUS

## 2015-04-05 MED ORDER — SODIUM CHLORIDE 0.9 % IJ SOLN: 3.0000 mL | INTRAMUSCULAR | Status: DC | PRN

## 2015-04-05 MED ORDER — NITROGLYCERIN 1 MG/10 ML FOR IR/CATH LAB
INTRA_ARTERIAL | Status: AC
  Filled 2015-04-05: qty 10

## 2015-04-05 MED ORDER — HEPARIN SODIUM (PORCINE) 1000 UNIT/ML IJ SOLN
INTRAMUSCULAR | Status: DC | PRN
  Administered 2015-04-05: 5000 [IU] via INTRAVENOUS

## 2015-04-05 MED ORDER — LIDOCAINE HCL (PF) 1 % IJ SOLN
INTRAMUSCULAR | Status: AC
  Filled 2015-04-05: qty 30

## 2015-04-05 MED ORDER — SODIUM CHLORIDE 0.9 % IV SOLN
INTRAVENOUS | Status: AC
  Administered 2015-04-05: 07:00:00 via INTRAVENOUS

## 2015-04-05 MED ORDER — SODIUM CHLORIDE 0.9 % IV SOLN: 250.0000 mL | INTRAVENOUS | Status: DC | PRN

## 2015-04-05 MED ORDER — SODIUM CHLORIDE 0.9 % IV SOLN: INTRAVENOUS | Status: AC

## 2015-04-05 MED ORDER — SODIUM CHLORIDE 0.9 % IV SOLN
250.0000 mL | INTRAVENOUS | Status: DC | PRN
Start: 2015-04-05 — End: 2015-04-05

## 2015-04-05 MED ORDER — IOHEXOL 350 MG/ML SOLN
INTRAVENOUS | Status: DC | PRN
  Administered 2015-04-05: 160 mL via INTRA_ARTERIAL

## 2015-04-05 SURGICAL SUPPLY — 19 items
CATH BALLN WEDGE 5F 110CM (CATHETERS) ×3 IMPLANT
CATH INFINITI 5 FR AL2 (CATHETERS) ×3 IMPLANT
CATH INFINITI 5 FR JL3.5 (CATHETERS) ×3 IMPLANT
CATH INFINITI 5FR AL3 (CATHETERS) ×3 IMPLANT
CATH INFINITI 5FR ANG PIGTAIL (CATHETERS) ×3 IMPLANT
CATH INFINITI 5FR JL5 (CATHETERS) ×3 IMPLANT
CATH INFINITI JR4 5F (CATHETERS) ×3 IMPLANT
DEVICE RAD COMP TR BAND LRG (VASCULAR PRODUCTS) ×3 IMPLANT
GLIDESHEATH SLEND SS 6F .021 (SHEATH) ×3 IMPLANT
GUIDE CATH RUNWAY 6FR AL3 (CATHETERS) ×3 IMPLANT
GUIDEWIRE .025 260CM (WIRE) ×3 IMPLANT
KIT HEART LEFT (KITS) ×3 IMPLANT
KIT HEART RIGHT NAMIC (KITS) ×3 IMPLANT
PACK CARDIAC CATHETERIZATION (CUSTOM PROCEDURE TRAY) ×3 IMPLANT
SHEATH FAST CATH BRACH 5F 5CM (SHEATH) ×3 IMPLANT
SYR MEDRAD MARK V 150ML (SYRINGE) ×3 IMPLANT
TRANSDUCER W/STOPCOCK (MISCELLANEOUS) ×3 IMPLANT
TUBING CIL FLEX 10 FLL-RA (TUBING) ×3 IMPLANT
WIRE SAFE-T 1.5MM-J .035X260CM (WIRE) ×3 IMPLANT

## 2015-04-05 NOTE — Progress Notes (Signed)
Right brachial removed without difficulty.  Catheter removed intact.  Pressure held for 10 minutes.  Site dressed with occlusive dressing.  Pt tolerated with no complaints.

## 2015-04-05 NOTE — Discharge Instructions (Signed)
Radial Site Care °Refer to this sheet in the next few weeks. These instructions provide you with information on caring for yourself after your procedure. Your caregiver may also give you more specific instructions. Your treatment has been planned according to current medical practices, but problems sometimes occur. Call your caregiver if you have any problems or questions after your procedure. °HOME CARE INSTRUCTIONS °· You may shower the day after the procedure. Remove the bandage (dressing) and gently wash the site with plain soap and water. Gently pat the site dry. °· Do not apply powder or lotion to the site. °· Do not submerge the affected site in water for 3 to 5 days. °· Inspect the site at least twice daily. °· Do not flex or bend the affected arm for 24 hours. °· No lifting over 5 pounds (2.3 kg) for 5 days after your procedure. °· Do not drive home if you are discharged the same day of the procedure. Have someone else drive you. °· You may drive 24 hours after the procedure unless otherwise instructed by your caregiver. °· Do not operate machinery or power tools for 24 hours. °· A responsible adult should be with you for the first 24 hours after you arrive home. °What to expect: °· Any bruising will usually fade within 1 to 2 weeks. °· Blood that collects in the tissue (hematoma) may be painful to the touch. It should usually decrease in size and tenderness within 1 to 2 weeks. °SEEK IMMEDIATE MEDICAL CARE IF: °· You have unusual pain at the radial site. °· You have redness, warmth, swelling, or pain at the radial site. °· You have drainage (other than a small amount of blood on the dressing). °· You have chills. °· You have a fever or persistent symptoms for more than 72 hours. °· You have a fever and your symptoms suddenly get worse. °· Your arm becomes pale, cool, tingly, or numb. °· You have heavy bleeding from the site. Hold pressure on the site. °Document Released: 10/24/2010 Document Revised:  12/14/2011 Document Reviewed: 10/24/2010 °Exit[employer]® Patient Information ©2015 [employer]. This information is not intended to replace advice given to you by your health care provider. Make sure you discuss any questions you have with your health care provider. ° °

## 2015-04-05 NOTE — H&P (View-Only) (Signed)
Patient ID: Levi Shepard, male   DOB: 1941/07/29, 74 y.o.   MRN: [id number]      Cardiology Office Note  Date:  03/28/2015   ID:  Levi Shepard, Monestime [birth year], MRN [id number]  PCP:  Levi Hopping, MD  Cardiologist:  Levi Spark, MD   Chief complain: Establish cardiology care (quit [facility])   History of Present Illness: Levi Shepard is a 74 y.o. male who presents for evaluation of AI, aortic root and ascending aortic aneurysm. The patient was first diagnosed in 2011 when he developed sign od DOE and underwent stress test, that was negative. He was found to have mild to moderate AI and dilated aortic root and ascending aorta - measuring 5.6 x 4.8 cm.  He was seen by Dr Levi Shepard in the past.  The most recent TTE done in 2014 showed dilated LVEDD 6.5 cm, LVEF 60-65%, mildly dilated left atrium, normal RVSP.  He is scheduled to see Dr Levi Shepard on 04/09/2015.  He is very active and denies any chest pain, but has stable DOE - with moderate exercise, he jogs about 1 mile 3x/week. No palpitations or syncope.  He has no LE edema, but has noticed PND on few occasions in the last few months.   Past Medical History  Diagnosis Date  . Acute sinusitis, unspecified   . Ascending aortic aneurysm     4.8cm x 4.6cm in 2013  . Personal history of colonic polyps     x1-has surveillance colonoscopies.Complete Colonoscopy;with polypectomy [phone]; has surveillance colonoscopies.   . Aortic valve insufficiency   . Unspecified asthma(493.90)   . Benign neoplasm of colon   . 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 Chest pain, unspecified   . Other dyspnea and respiratory abnormality   . Hypertension   . Hyperlipidemia   . Other testicular hypofunction   . Impotence of organic origin   . Nonspecific elevation of levels of transaminase or lactic acid dehydrogenase (LDH)   . Disorders of bursae and tendons in  shoulder region, unspecified   . Special screening for malignant neoplasm of prostate     Past Surgical History  Procedure Laterality Date  . Orif radial shaft fracture      Clo Treat of Fracture of Radial Shaft, Distal Dislocation; bilateral-age 89 years     Current Outpatient Prescriptions  Medication Sig Dispense Refill  . albuterol (PROVENTIL,VENTOLIN) 90 MCG/ACT inhaler Inhale 1-2 puffs into the lungs every 4 (four) hours as needed.    Marland Kitchen aspirin EC 81 MG tablet Take 1 tablet (81 mg total) by mouth daily.    . Cholecalciferol (VITAMIN D PO) Take 400 Units by mouth daily.    . CYCLOBENZAPRINE HCL PO 10mg  tablet. Take 1/2 to 1 tablet at bedtime as needed for neck/shoulder pain    . ERTACZO 2 % CREA daily as needed.     . fenofibrate micronized (LOFIBRA) 67 MG capsule Take 67 mg by mouth daily.    Marland Kitchen LOVASTATIN PO Take 20 mg by mouth daily.    . Multiple Vitamin (MULTIVITAMIN) capsule Take 1 capsule by mouth daily.    . Multiple Vitamins-Minerals (ZINC PO) Take 1 tablet by mouth daily.    . Tamsulosin HCl (FLOMAX) 0.4 MG CAPS Take 0.4 mg by mouth at bedtime.     No current facility-administered medications for this visit.    Allergies:   Demerol and Morphine    Social History:  The patient  reports that he has never smoked. He does not have any smokeless tobacco history on file. He reports that he drinks alcohol. He reports that he does not use illicit drugs.   Family History:  The patient's family history is negative for Anemia, Arrhythmia, Asthma, Fainting, Clotting disorder, Heart attack, Heart disease, Heart failure, Hyperlipidemia, and Hypertension.    ROS:  Please see the history of present illness.   Otherwise, review of systems are positive for none.   All other systems are reviewed and negative.    PHYSICAL EXAM: VS:  BP 132/78 mmHg  Pulse 64  Ht 6' (1.829 m)  Wt 191 lb (86.637 kg)  BMI 25.90 kg/m2 , BMI Body mass index is 25.9 kg/(m^2). GEN: Well nourished, well  developed, in no acute distress HEENT: normal Neck: no JVD, carotid bruits, or masses Cardiac: RRR; 3/6 diastolic murmur at the LUSB, rubs, or gallops,no edema  Respiratory:  clear to auscultation bilaterally, normal work of breathing GI: soft, nontender, nondistended, + BS MS: no deformity or atrophy Skin: warm and dry, no rash Neuro:  Strength and sensation are intact Psych: euthymic mood, full affect  EKG:  EKG is not ordered today.  Recent Labs: 03/19/2015: BUN 27*; Creat 1.38*   Lipid Panel No results found for: CHOL, TRIG, HDL, CHOLHDL, VLDL, LDLCALC, LDLDIRECT   Wt Readings from Last 3 Encounters:  03/28/15 191 lb (86.637 kg)  03/26/15 191 lb (86.637 kg)  03/13/15 188 lb (85.276 kg)    CT chest 6:15/2016 Mild aneurysmal dilatation of the ascending thoracic aorta is noted, measuring 5.6 cm just above the sino-tubular junction which is increased compared to prior exam. Cardiothoracic surgery consultation recommended due to increased risk of rupture for arch aneurysm ? 5.5 cm.   Other studies Reviewed: Additional studies/ records that were reviewed today include: as per HPI    ASSESSMENT AND PLAN:  74 year old male   1. Aortic root dilatation, ascending aortic aneurysm (5.6 cm), aortic regurgitation - the patient is scheduled to see Dr Levi Shepard for the consideration of AVR and aortic rot replacement  - we will order echocardiogram to re-evaluate LV size and function as well as RVSP - we will schedule a left cardiac cath to evaluate coronaries - the patient is advised to avoid heavy lifting and isometric exercise - add aspirin 81 mg po  - schedule pre cath labs - BMP, CBC, INR  2. Hypertension - well controlled  3. Hyperlipidemia - on lovastatin   Labs/ tests ordered today include:   Orders Placed This Encounter  Procedures  . Basic metabolic panel  . CBC w/Diff  . INR/PT  . EKG 12-Lead  . Echocardiogram   Disposition:   FU with Levi Shepard in  3 month.  Signed, Levi Spark, MD  03/28/2015 1:07 PM    [facility] [address], [place], [place]  [address] Phone: [phone]; Fax: [phone]

## 2015-04-05 NOTE — Interval H&P Note (Signed)
History and Physical Interval Note:  04/05/2015 10:48 AM  Levi Shepard  has presented today for surgery, with the diagnosis of arotic value insufficieny  The various methods of treatment have been discussed with the patient and family. After consideration of risks, benefits and other options for treatment, the patient has consented to  Procedure(s): Right/Left Heart Cath and Coronary Angiography (N/A) as a surgical intervention .  The patient's history has been reviewed, patient examined, no change in status, stable for surgery.  I have reviewed the patient's chart and labs.  Questions were answered to the patient's satisfaction.     Sherren Mocha

## 2015-04-05 DEATH — deceased

## 2015-04-09 ENCOUNTER — Encounter (HOSPITAL_COMMUNITY): Payer: Self-pay | Admitting: Cardiovascular Disease

## 2015-04-09 ENCOUNTER — Ambulatory Visit (INDEPENDENT_AMBULATORY_CARE_PROVIDER_SITE_OTHER): Payer: Medicare Other | Admitting: Thoracic Surgery (Cardiothoracic Vascular Surgery)

## 2015-04-09 VITALS — BP 140/79 | HR 83 | Resp 20 | Ht 72.0 in | Wt 189.0 lb

## 2015-04-09 DIAGNOSIS — I10 Essential (primary) hypertension: Secondary | ICD-10-CM | POA: Diagnosis not present

## 2015-04-09 DIAGNOSIS — I712 Thoracic aortic aneurysm, without rupture: Secondary | ICD-10-CM

## 2015-04-09 DIAGNOSIS — I351 Nonrheumatic aortic (valve) insufficiency: Secondary | ICD-10-CM | POA: Diagnosis not present

## 2015-04-09 DIAGNOSIS — I7121 Aneurysm of the ascending aorta, without rupture: Secondary | ICD-10-CM

## 2015-04-09 LAB — POCT I-STAT 3, VENOUS BLOOD GAS (G3P V)
Acid-base deficit: 2 mmol/L (ref 0.0–2.0)
Bicarbonate: 22.8 mEq/L (ref 20.0–24.0)
O2 SAT: 79 %
PCO2 VEN: 38.7 mmHg — AB (ref 45.0–50.0)
PO2 VEN: 44 mmHg (ref 30.0–45.0)
TCO2: 24 mmol/L (ref 0–100)
pH, Ven: 7.377 — ABNORMAL HIGH (ref 7.250–7.300)

## 2015-04-09 NOTE — Progress Notes (Signed)
[address]       [place] [address]             [phone]       HPI: [name] returns to discuss the results of his recent studies and schedule surgery.  He is a 74 year old gentleman who was first found to have an a ascending and aortic root aneurysm in 2011. The aneurysm was 4.8 x 4.6 cm and he had mild to moderate AI as well.  I saw him in January of 2013 and recommended an MR in 6 months. The MR showed no significant change. After that he was followed by [facility], but says he never saw the same MD twice. He had a CT angiogram in August of 2015. He decided to come to [facility] for follow-up of the aneurysm.  I saw him last week and recommended that we repeat his CT angiogram as it had been nearly a year since his most recent study. He had that done last week and now returns to discuss the results.  He remains active and only gets SOB with heavy exertion. He can walk a mile at a steady pace without issues. He denies chest pain, pressure or tightness. He has recently noted several episodes of paroxysmal nocturnal dyspnea. He denies orthopnea and peripheral edema.  He had an echocardiogram and cardiac catheterization.   Current Outpatient Prescriptions  Medication Sig Dispense Refill  . aspirin EC 81 MG tablet Take 1 tablet (81 mg total) by mouth daily.    . Cholecalciferol (VITAMIN D PO) Take 400 Units by mouth daily.    . cyclobenzaprine (FLEXERIL) 10 MG tablet Take 5-10 mg by mouth at bedtime as needed for muscle spasms.    Marland Kitchen ERTACZO 2 % CREA Apply 1 application topically daily as needed (rash).     . fenofibrate micronized (LOFIBRA) 67 MG capsule Take 67 mg by mouth daily.    Marland Kitchen lovastatin (MEVACOR) 20 MG tablet Take 20 mg by mouth daily.  1  . meloxicam (MOBIC) 15 MG tablet Take 15 mg by mouth daily.  2  . methocarbamol (ROBAXIN) 500 MG tablet Take 500 mg by mouth 3 (three) times daily.  2  . Multiple Vitamin (MULTIVITAMIN) capsule Take  1 capsule by mouth daily.    . Tamsulosin HCl (FLOMAX) 0.4 MG CAPS Take 0.4 mg by mouth at bedtime.     No current facility-administered medications for this visit.    Physical Exam  Diagnostic Tests: I personally reviewed the studies listed below and concur with the official reports  Echocardiogram 04/01/2015 Study Conclusions  - Left ventricle: The cavity size was normal. There was moderate concentric hypertrophy. Systolic function was normal. The estimated ejection fraction was in the range of 50% to 55%. Wall motion was normal; there were no regional wall motion abnormalities. Due to first degree atrioventricular block, there was fusion of early and atrial contributions to ventricular filling. - Ventricular septum: Septal motion showed paradox. - Aortic valve: Valve area (VTI): 3.56 cm^2. Valve area (Vmean): 3.33 cm^2. - Aortic root: The aortic root was moderately dilated. - Ascending aorta: The ascending aorta was severely dilated. - Mitral valve: There was mild to moderate regurgitation directed centrally. - Left atrium: The atrium was mildly dilated.  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  Levi Shepard is a 74 year old gentleman with a 5.6 cm ascending aortic aneurysm and dilated aortic root. The aorta returns to a relatively normal size just beyond the takeoff of the innominate artery. The aneurysm has grown over the past year. His workup showed no significant disease in the coronary arteries. He had moderate aortic regurgitation and mild-to-moderate mitral regurgitation. His EF is normal.  I discussed the proposed operation with [name] and his sisters. They understand we will plan to replace the aortic root and ascending aorta. This would require cardiac  pulmonary bypass and in all likelihood a period of deep hypothermic circulatory arrest. I described the general nature of the procedure including the incisions to be used, the use of the heart-lung machine, the expected hospital stay, and the overall recovery. We discussed intraoperative decision-making regarding valve resuspension versus replacement. If replacement is required we would use a tissue valve.  I informed them of the indications, risks, benefits, and alternatives. He understands the risks include, but are not limited to, death, MI, stroke, other neurologic dysfunction, bleeding, need for transfusion, infection, renal failure, gastrointestinal complications, respiratory failure, as well as the possibility of other unforeseeable complications. I had discussed the possibility of heart block requiring permanent pacemaker placement at his last visit.  He understands and accepts the risks and agrees to proceed.  Plan:  Replacement of aortic root and ascending aorta (Bentall procedure) on Wednesday 05/01/2015  Melrose Nakayama, MD [facility] [phone]

## 2015-04-10 ENCOUNTER — Other Ambulatory Visit: Payer: Self-pay | Admitting: *Deleted

## 2015-04-10 DIAGNOSIS — I719 Aortic aneurysm of unspecified site, without rupture: Secondary | ICD-10-CM

## 2015-04-10 MED FILL — Heparin Sodium (Porcine) 2 Unit/ML in Sodium Chloride 0.9%: INTRAMUSCULAR | Qty: 1000 | Status: AC

## 2015-04-10 MED FILL — Nitroglycerin IV Soln 100 MCG/ML in D5W: INTRA_ARTERIAL | Qty: 10 | Status: AC

## 2015-04-11 ENCOUNTER — Encounter (HOSPITAL_COMMUNITY)
Admission: RE | Admit: 2015-04-11 | Discharge: 2015-04-11 | Disposition: A | Discharge status: Home / Self Care | Payer: Medicare Other | Source: Ambulatory Visit | Attending: Thoracic Surgery (Cardiothoracic Vascular Surgery) | Admitting: Thoracic Surgery (Cardiothoracic Vascular Surgery)

## 2015-04-11 ENCOUNTER — Encounter (HOSPITAL_COMMUNITY): Payer: Self-pay

## 2015-04-11 VITALS — BP 133/68 | HR 78 | Temp 97.6°F | Resp 20 | Ht 72.0 in | Wt 189.9 lb

## 2015-04-11 DIAGNOSIS — Z01812 Encounter for preprocedural laboratory examination: Secondary | ICD-10-CM | POA: Insufficient documentation

## 2015-04-11 DIAGNOSIS — Z0183 Encounter for blood typing: Secondary | ICD-10-CM | POA: Insufficient documentation

## 2015-04-11 DIAGNOSIS — I719 Aortic aneurysm of unspecified site, without rupture: Secondary | ICD-10-CM | POA: Diagnosis not present

## 2015-04-11 DIAGNOSIS — Z01818 Encounter for other preprocedural examination: Secondary | ICD-10-CM | POA: Insufficient documentation

## 2015-04-11 HISTORY — DX: Personal history of other diseases of the digestive system: Z87.19

## 2015-04-11 HISTORY — DX: Unspecified osteoarthritis, unspecified site: M19.90

## 2015-04-11 HISTORY — DX: Adverse effect of unspecified anesthetic, initial encounter: T41.45XA

## 2015-04-11 HISTORY — DX: Other specified postprocedural states: Z98.890

## 2015-04-11 HISTORY — DX: Other specified postprocedural states: R11.2

## 2015-04-11 HISTORY — DX: Cardiac murmur, unspecified: R01.1

## 2015-04-11 HISTORY — DX: Malignant (primary) neoplasm, unspecified: C80.1

## 2015-04-11 HISTORY — DX: Other complications of anesthesia, initial encounter: T88.59XA

## 2015-04-11 LAB — COMPREHENSIVE METABOLIC PANEL
ALBUMIN: 4.1 g/dL (ref 3.5–5.0)
ALK PHOS: 35 U/L — AB (ref 38–126)
ALT: 27 U/L (ref 17–63)
AST: 31 U/L (ref 15–41)
Anion gap: 10 (ref 5–15)
BUN: 22 mg/dL — AB (ref 6–20)
CHLORIDE: 108 mmol/L (ref 101–111)
CO2: 19 mmol/L — ABNORMAL LOW (ref 22–32)
CREATININE: 1.23 mg/dL (ref 0.61–1.24)
Calcium: 10.2 mg/dL (ref 8.9–10.3)
GFR calc Af Amer: >60 mL/min (ref >60)
GFR calc non Af Amer: 57 mL/min — ABNORMAL LOW (ref >60)
GLUCOSE: 100 mg/dL — AB (ref 65–99)
Potassium: 4.4 mmol/L (ref 3.5–5.1)
Sodium: 137 mmol/L (ref 135–145)
Total Bilirubin: 1.2 mg/dL (ref 0.3–1.2)
Total Protein: 7.5 g/dL (ref 6.5–8.1)

## 2015-04-11 LAB — URINALYSIS, ROUTINE W REFLEX MICROSCOPIC
Bilirubin Urine: NEGATIVE
Glucose, UA: NEGATIVE mg/dL
Hgb urine dipstick: NEGATIVE
KETONES UR: NEGATIVE mg/dL
Leukocytes, UA: NEGATIVE
NITRITE: NEGATIVE
PROTEIN: NEGATIVE mg/dL
Specific Gravity, Urine: 1.015 (ref 1.005–1.030)
Urobilinogen, UA: 0.2 mg/dL (ref 0.0–1.0)
pH: 5 (ref 5.0–8.0)

## 2015-04-11 LAB — SURGICAL PCR SCREEN
MRSA, PCR: NEGATIVE
Staphylococcus aureus: POSITIVE — AB

## 2015-04-11 LAB — BLOOD GAS, ARTERIAL
Acid-base deficit: 3.4 mmol/L — ABNORMAL HIGH (ref 0.0–2.0)
BICARBONATE: 19.9 meq/L — AB (ref 20.0–24.0)
Drawn by: 428831
FIO2: 0.21 %
O2 Saturation: 95.2 %
PCO2 ART: 28.6 mmHg — AB (ref 35.0–45.0)
PH ART: 7.456 — AB (ref 7.350–7.450)
Patient temperature: 98.6
TCO2: 20.7 mmol/L (ref 0–100)
pO2, Arterial: 72.3 mmHg — ABNORMAL LOW (ref 80.0–100.0)

## 2015-04-11 LAB — CBC
HCT: 50.5 % (ref 39.0–52.0)
HEMOGLOBIN: 17.3 g/dL — AB (ref 13.0–17.0)
MCH: 31.9 pg (ref 26.0–34.0)
MCHC: 34.3 g/dL (ref 30.0–36.0)
MCV: 93.2 fL (ref 78.0–100.0)
Platelets: 232 10*3/uL (ref 150–400)
RBC: 5.42 MIL/uL (ref 4.22–5.81)
RDW: 12.8 % (ref 11.5–15.5)
WBC: 8.4 10*3/uL (ref 4.0–10.5)

## 2015-04-11 LAB — PROTIME-INR
INR: 1.1 (ref 0.00–1.49)
Prothrombin Time: 14.4 seconds (ref 11.6–15.2)

## 2015-04-11 LAB — APTT: aPTT: 28 seconds (ref 24–37)

## 2015-04-11 LAB — ABO/RH: ABO/RH(D): A POS

## 2015-04-11 NOTE — Pre-Procedure Instructions (Signed)
JEB SCHLOEMER  04/11/2015      [facility] - [place], [place] - [address] [place] [address] Phone: [phone] Fax: [phone]    Your procedure is scheduled on Wed, July 13 @ 8:30 AM  Report to [facility] Admitting at 6:30 AM.  Call this number if you have problems the morning of surgery:  [phone]   Remember:  Do not eat food or drink liquids after midnight.              Stop taking your Meloxicam and Vitamins. No Goody's,BC's,Aleve,Aspirin,Ibuprofen,Fish Oil,or any Herbal Medications.    Do not wear jewelry.  Do not wear lotions, powders, or colognes.    Men may shave face and neck.  Do not bring valuables to the hospital.  [facility] is not responsible for any belongings or valuables.  Contacts, dentures or bridgework may not be worn into surgery.  Leave your suitcase in the car.  After surgery it may be brought to your room.  For patients admitted to the hospital, discharge time will be determined by your treatment team.  Patients discharged the day of surgery will not be allowed to drive home.    Special instructions:  [facility] - Preparing for Surgery  Before surgery, you can play an important role.  Because skin is not sterile, your skin needs to be as free of germs as possible.  You can reduce the number of germs on you skin by washing with CHG (chlorahexidine gluconate) soap before surgery.  CHG is an antiseptic cleaner which kills germs and bonds with the skin to continue killing germs even after washing.  Please DO NOT use if you have an allergy to CHG or antibacterial soaps.  If your skin becomes reddened/irritated stop using the CHG and inform your nurse when you arrive at Short Stay.  Do not shave (including legs and underarms) for at least 48 hours prior to the first CHG shower.  You may shave your face.  Please follow these instructions carefully:   1.   Shower with CHG Soap the night before surgery and the                                morning of Surgery.  2.  If you choose to wash your hair, wash your hair first as usual with your       normal shampoo.  3.  After you shampoo, rinse your hair and body thoroughly to remove the                      Shampoo.  4.  Use CHG as you would any other liquid soap.  You can apply chg directly       to the skin and wash gently with scrungie or a clean washcloth.  5.  Apply the CHG Soap to your body ONLY FROM THE NECK DOWN.        Do not use on open wounds or open sores.  Avoid contact with your eyes,       ears, mouth and genitals (private parts).  Wash genitals (private parts)       with your normal soap.  6.  Wash thoroughly, paying special attention to the area where your surgery  will be performed.  7.  Thoroughly rinse your body with warm water from the neck down.  8.  DO NOT shower/wash with your normal soap after using and rinsing off       the CHG Soap.  9.  Pat yourself dry with a clean towel.            10.  Wear clean pajamas.            11.  Place clean sheets on your bed the night of your first shower and do not        sleep with pets.  Day of Surgery  Do not apply any lotions/deoderants the morning of surgery.  Please wear clean clothes to the hospital/surgery center.    Please read over the following fact sheets that you were given. Pain Booklet, Coughing and Deep Breathing, Blood Transfusion Information, MRSA Information and Surgical Site Infection Prevention

## 2015-04-12 LAB — HEMOGLOBIN A1C
HEMOGLOBIN A1C: 5.8 % — AB (ref 4.8–5.6)
Mean Plasma Glucose: 120 mg/dL

## 2015-04-15 ENCOUNTER — Ambulatory Visit (HOSPITAL_COMMUNITY)
Admission: RE | Admit: 2015-04-15 | Discharge: 2015-04-15 | Disposition: A | Discharge status: Home / Self Care | Payer: Medicare Other | Source: Ambulatory Visit | Attending: Thoracic Surgery (Cardiothoracic Vascular Surgery) | Admitting: Thoracic Surgery (Cardiothoracic Vascular Surgery)

## 2015-04-15 DIAGNOSIS — J9600 Acute respiratory failure, unspecified whether with hypoxia or hypercapnia: Secondary | ICD-10-CM | POA: Diagnosis not present

## 2015-04-15 DIAGNOSIS — I719 Aortic aneurysm of unspecified site, without rupture: Secondary | ICD-10-CM

## 2015-04-15 DIAGNOSIS — I712 Thoracic aortic aneurysm, without rupture: Secondary | ICD-10-CM | POA: Diagnosis not present

## 2015-04-15 DIAGNOSIS — G935 Compression of brain: Secondary | ICD-10-CM | POA: Diagnosis not present

## 2015-04-15 DIAGNOSIS — G936 Cerebral edema: Secondary | ICD-10-CM | POA: Diagnosis not present

## 2015-04-15 LAB — PULMONARY FUNCTION TEST
DL/VA % pred: 107 %
DL/VA: 5.05 ml/min/mmHg/L
DLCO cor % pred: 102 %
DLCO cor: 35.11 ml/min/mmHg
DLCO unc % pred: 109 %
DLCO unc: 37.52 ml/min/mmHg
FEF 25-75 POST: 3.62 L/s
FEF 25-75 Pre: 3.43 L/sec
FEF2575-%Change-Post: 5 %
FEF2575-%Pred-Post: 144 %
FEF2575-%Pred-Pre: 137 %
FEV1-%Change-Post: 1 %
FEV1-%Pred-Post: 109 %
FEV1-%Pred-Pre: 108 %
FEV1-Post: 3.69 L
FEV1-Pre: 3.65 L
FEV1FVC-%Change-Post: 1 %
FEV1FVC-%Pred-Pre: 107 %
FEV6-%Change-Post: 1 %
FEV6-%Pred-Post: 103 %
FEV6-%Pred-Pre: 102 %
FEV6-PRE: 4.43 L
FEV6-Post: 4.48 L
FEV6FVC-%Change-Post: 1 %
FEV6FVC-%Pred-Post: 102 %
FEV6FVC-%Pred-Pre: 101 %
FVC-%Change-Post: 0 %
FVC-%PRED-POST: 101 %
FVC-%PRED-PRE: 101 %
FVC-Post: 4.66 L
FVC-Pre: 4.65 L
POST FEV6/FVC RATIO: 96 %
Post FEV1/FVC ratio: 79 %
Pre FEV1/FVC ratio: 78 %
Pre FEV6/FVC Ratio: 95 %
RV % pred: 117 %
RV: 3.04 L
TLC % pred: 108 %
TLC: 7.93 L

## 2015-04-15 MED ORDER — ALBUTEROL SULFATE (2.5 MG/3ML) 0.083% IN NEBU
2.5000 mg | INHALATION_SOLUTION | Freq: Once | RESPIRATORY_TRACT | Status: AC
  Administered 2015-04-15: 2.5 mg via RESPIRATORY_TRACT

## 2015-04-15 NOTE — Progress Notes (Signed)
Pre-op Cardiac Surgery  Carotid Findings:  1-39% ICA stenosis.  Vertebral artery flow is antegrade.   Upper Extremity Right Left  Brachial Pressures 156T  158T  Radial Waveforms T T  Ulnar Waveforms T T  Palmar Arch (Allen's Test) WNL WNL   Findings:  WNL    Lower  Extremity Right Left  Dorsalis Pedis    Anterior Tibial    Posterior Tibial    Ankle/Brachial Indices      Findings:

## 2015-04-16 MED ORDER — INSULIN REGULAR HUMAN 100 UNIT/ML IJ SOLN
INTRAMUSCULAR | Status: DC
  Filled 2015-04-16: qty 2.5

## 2015-04-16 MED ORDER — DEXMEDETOMIDINE HCL IN NACL 400 MCG/100ML IV SOLN
0.1000 ug/kg/h | INTRAVENOUS | Status: DC
  Filled 2015-04-16: qty 100

## 2015-04-16 MED ORDER — VANCOMYCIN HCL 10 G IV SOLR
1500.0000 mg | INTRAVENOUS | Status: DC
  Filled 2015-04-16: qty 1500

## 2015-04-16 MED ORDER — DEXTROSE 5 % IV SOLN
1.5000 g | INTRAVENOUS | Status: AC
  Administered 2015-04-17: .75 g via INTRAVENOUS
  Administered 2015-04-17: 1.5 g via INTRAVENOUS
  Filled 2015-04-16: qty 1.5

## 2015-04-16 MED ORDER — SODIUM CHLORIDE 0.9 % IV SOLN
INTRAVENOUS | Status: DC
  Filled 2015-04-16: qty 30

## 2015-04-16 MED ORDER — NITROGLYCERIN IN D5W 200-5 MCG/ML-% IV SOLN
2.0000 ug/min | INTRAVENOUS | Status: AC
  Administered 2015-04-17: 5 ug/min via INTRAVENOUS
  Filled 2015-04-16: qty 250

## 2015-04-16 MED ORDER — SODIUM CHLORIDE 0.9 % IV SOLN
INTRAVENOUS | Status: DC
  Filled 2015-04-16: qty 40

## 2015-04-16 MED ORDER — POTASSIUM CHLORIDE 2 MEQ/ML IV SOLN
80.0000 meq | INTRAVENOUS | Status: DC
  Filled 2015-04-16: qty 40

## 2015-04-16 MED ORDER — DEXTROSE 5 % IV SOLN
750.0000 mg | INTRAVENOUS | Status: DC
  Filled 2015-04-16: qty 750

## 2015-04-16 MED ORDER — PLASMA-LYTE 148 IV SOLN
INTRAVENOUS | Status: AC
  Administered 2015-04-17: 500 mL
  Filled 2015-04-16: qty 2.5

## 2015-04-16 MED ORDER — METOPROLOL TARTRATE 12.5 MG HALF TABLET: 12.5000 mg | ORAL_TABLET | ORAL | Status: DC

## 2015-04-16 MED ORDER — DOPAMINE-DEXTROSE 3.2-5 MG/ML-% IV SOLN
0.0000 ug/kg/min | INTRAVENOUS | Status: AC
  Administered 2015-04-17: 5 ug/kg/min via INTRAVENOUS
  Filled 2015-04-16: qty 250

## 2015-04-16 MED ORDER — PHENYLEPHRINE HCL 10 MG/ML IJ SOLN
30.0000 ug/min | INTRAVENOUS | Status: DC
  Filled 2015-04-16: qty 2

## 2015-04-16 MED ORDER — EPINEPHRINE HCL 1 MG/ML IJ SOLN
0.0000 ug/min | INTRAMUSCULAR | Status: AC
  Administered 2015-04-17: 2 ug/min via INTRAVENOUS
  Filled 2015-04-16: qty 4

## 2015-04-16 MED ORDER — MAGNESIUM SULFATE 50 % IJ SOLN
40.0000 meq | INTRAMUSCULAR | Status: DC
  Filled 2015-04-16: qty 10

## 2015-04-17 ENCOUNTER — Encounter (HOSPITAL_COMMUNITY): Payer: Self-pay | Admitting: Anesthesiology

## 2015-04-17 ENCOUNTER — Encounter (HOSPITAL_COMMUNITY)
Admission: RE | Disposition: E | Payer: Medicare Other | Source: Ambulatory Visit | Attending: Thoracic Surgery (Cardiothoracic Vascular Surgery)

## 2015-04-17 ENCOUNTER — Inpatient Hospital Stay (HOSPITAL_COMMUNITY): Payer: Medicare Other | Admitting: Anesthesiology

## 2015-04-17 ENCOUNTER — Inpatient Hospital Stay (HOSPITAL_COMMUNITY)
Admission: RE | Admit: 2015-04-17 | Discharge: 2015-05-06 | DRG: 219 | Disposition: E | Payer: Medicare Other | Source: Ambulatory Visit | Attending: Thoracic Surgery (Cardiothoracic Vascular Surgery) | Admitting: Thoracic Surgery (Cardiothoracic Vascular Surgery)

## 2015-04-17 ENCOUNTER — Inpatient Hospital Stay (HOSPITAL_COMMUNITY): Payer: Medicare Other

## 2015-04-17 DIAGNOSIS — D6959 Other secondary thrombocytopenia: Secondary | ICD-10-CM | POA: Diagnosis not present

## 2015-04-17 DIAGNOSIS — G9782 Other postprocedural complications and disorders of nervous system: Secondary | ICD-10-CM | POA: Diagnosis not present

## 2015-04-17 DIAGNOSIS — D62 Acute posthemorrhagic anemia: Secondary | ICD-10-CM | POA: Diagnosis not present

## 2015-04-17 DIAGNOSIS — I63411 Cerebral infarction due to embolism of right middle cerebral artery: Secondary | ICD-10-CM | POA: Diagnosis not present

## 2015-04-17 DIAGNOSIS — D696 Thrombocytopenia, unspecified: Secondary | ICD-10-CM | POA: Diagnosis not present

## 2015-04-17 DIAGNOSIS — Z66 Do not resuscitate: Secondary | ICD-10-CM | POA: Diagnosis not present

## 2015-04-17 DIAGNOSIS — I97820 Postprocedural cerebrovascular infarction during cardiac surgery: Secondary | ICD-10-CM | POA: Diagnosis not present

## 2015-04-17 DIAGNOSIS — R001 Bradycardia, unspecified: Secondary | ICD-10-CM | POA: Diagnosis not present

## 2015-04-17 DIAGNOSIS — R414 Neurologic neglect syndrome: Secondary | ICD-10-CM | POA: Diagnosis not present

## 2015-04-17 DIAGNOSIS — G8194 Hemiplegia, unspecified affecting left nondominant side: Secondary | ICD-10-CM

## 2015-04-17 DIAGNOSIS — E876 Hypokalemia: Secondary | ICD-10-CM | POA: Diagnosis not present

## 2015-04-17 DIAGNOSIS — G936 Cerebral edema: Secondary | ICD-10-CM

## 2015-04-17 DIAGNOSIS — G935 Compression of brain: Secondary | ICD-10-CM | POA: Diagnosis not present

## 2015-04-17 DIAGNOSIS — J9600 Acute respiratory failure, unspecified whether with hypoxia or hypercapnia: Secondary | ICD-10-CM | POA: Diagnosis not present

## 2015-04-17 DIAGNOSIS — R739 Hyperglycemia, unspecified: Secondary | ICD-10-CM | POA: Diagnosis present

## 2015-04-17 DIAGNOSIS — I7101 Dissection of thoracic aorta: Secondary | ICD-10-CM | POA: Diagnosis not present

## 2015-04-17 DIAGNOSIS — E870 Hyperosmolality and hypernatremia: Secondary | ICD-10-CM | POA: Diagnosis not present

## 2015-04-17 DIAGNOSIS — Z7982 Long term (current) use of aspirin: Secondary | ICD-10-CM | POA: Diagnosis not present

## 2015-04-17 DIAGNOSIS — I080 Rheumatic disorders of both mitral and aortic valves: Secondary | ICD-10-CM | POA: Diagnosis present

## 2015-04-17 DIAGNOSIS — I7121 Aneurysm of the ascending aorta, without rupture: Secondary | ICD-10-CM

## 2015-04-17 DIAGNOSIS — I712 Thoracic aortic aneurysm, without rupture: Secondary | ICD-10-CM | POA: Diagnosis present

## 2015-04-17 DIAGNOSIS — J988 Other specified respiratory disorders: Secondary | ICD-10-CM | POA: Insufficient documentation

## 2015-04-17 DIAGNOSIS — J9811 Atelectasis: Secondary | ICD-10-CM

## 2015-04-17 DIAGNOSIS — I10 Essential (primary) hypertension: Secondary | ICD-10-CM | POA: Diagnosis present

## 2015-04-17 DIAGNOSIS — Z79899 Other long term (current) drug therapy: Secondary | ICD-10-CM

## 2015-04-17 DIAGNOSIS — I6340 Cerebral infarction due to embolism of unspecified cerebral artery: Secondary | ICD-10-CM | POA: Diagnosis not present

## 2015-04-17 DIAGNOSIS — N400 Enlarged prostate without lower urinary tract symptoms: Secondary | ICD-10-CM | POA: Diagnosis present

## 2015-04-17 DIAGNOSIS — Z515 Encounter for palliative care: Secondary | ICD-10-CM

## 2015-04-17 DIAGNOSIS — R0603 Acute respiratory distress: Secondary | ICD-10-CM

## 2015-04-17 DIAGNOSIS — J690 Pneumonitis due to inhalation of food and vomit: Secondary | ICD-10-CM | POA: Diagnosis not present

## 2015-04-17 DIAGNOSIS — I719 Aortic aneurysm of unspecified site, without rupture: Secondary | ICD-10-CM

## 2015-04-17 DIAGNOSIS — E785 Hyperlipidemia, unspecified: Secondary | ICD-10-CM | POA: Diagnosis present

## 2015-04-17 DIAGNOSIS — Z4659 Encounter for fitting and adjustment of other gastrointestinal appliance and device: Secondary | ICD-10-CM

## 2015-04-17 DIAGNOSIS — J9601 Acute respiratory failure with hypoxia: Secondary | ICD-10-CM | POA: Diagnosis not present

## 2015-04-17 DIAGNOSIS — I639 Cerebral infarction, unspecified: Secondary | ICD-10-CM

## 2015-04-17 DIAGNOSIS — Z978 Presence of other specified devices: Secondary | ICD-10-CM

## 2015-04-17 DIAGNOSIS — I63311 Cerebral infarction due to thrombosis of right middle cerebral artery: Secondary | ICD-10-CM | POA: Diagnosis not present

## 2015-04-17 HISTORY — PX: TEE WITHOUT CARDIOVERSION: SHX5443

## 2015-04-17 HISTORY — PX: BENTALL PROCEDURE: SHX5058

## 2015-04-17 HISTORY — PX: AORTIC VALVE REPLACEMENT: SHX41

## 2015-04-17 LAB — POCT I-STAT 3, ART BLOOD GAS (G3+)
ACID-BASE DEFICIT: 4 mmol/L — AB (ref 0.0–2.0)
Acid-base deficit: 1 mmol/L (ref 0.0–2.0)
Acid-base deficit: 4 mmol/L — ABNORMAL HIGH (ref 0.0–2.0)
BICARBONATE: 24.6 meq/L — AB (ref 20.0–24.0)
BICARBONATE: 24.1 meq/L — AB (ref 20.0–24.0)
BICARBONATE: 26 meq/L — AB (ref 20.0–24.0)
Bicarbonate: 22.5 mEq/L (ref 20.0–24.0)
O2 SAT: 93 %
O2 Saturation: 100 %
O2 Saturation: 97 %
O2 Saturation: 95 %
PCO2 ART: 48.5 mmHg — AB (ref 35.0–45.0)
PH ART: 7.197 — AB (ref 7.350–7.450)
PH ART: 7.359 (ref 7.350–7.450)
PO2 ART: 329 mmHg — AB (ref 80.0–100.0)
TCO2: 26 mmol/L (ref 0–100)
TCO2: 26 mmol/L (ref 0–100)
TCO2: 24 mmol/L (ref 0–100)
TCO2: 27 mmol/L (ref 0–100)
pCO2 arterial: 45.1 mmHg — ABNORMAL HIGH (ref 35.0–45.0)
pCO2 arterial: 62 mmHg (ref 35.0–45.0)
pCO2 arterial: 46 mmHg — ABNORMAL HIGH (ref 35.0–45.0)
pH, Arterial: 7.344 — ABNORMAL LOW (ref 7.350–7.450)
pH, Arterial: 7.274 — ABNORMAL LOW (ref 7.350–7.450)
pO2, Arterial: 110 mmHg — ABNORMAL HIGH (ref 80.0–100.0)
pO2, Arterial: 88 mmHg (ref 80.0–100.0)
pO2, Arterial: 69 mmHg — ABNORMAL LOW (ref 80.0–100.0)

## 2015-04-17 LAB — GLUCOSE, CAPILLARY
Glucose-Capillary: 130 mg/dL — ABNORMAL HIGH (ref 65–99)
Glucose-Capillary: 139 mg/dL — ABNORMAL HIGH (ref 65–99)
Glucose-Capillary: 122 mg/dL — ABNORMAL HIGH (ref 65–99)
Glucose-Capillary: 100 mg/dL — ABNORMAL HIGH (ref 65–99)
Glucose-Capillary: 111 mg/dL — ABNORMAL HIGH (ref 65–99)
Glucose-Capillary: 127 mg/dL — ABNORMAL HIGH (ref 65–99)

## 2015-04-17 LAB — APTT: APTT: 32 s (ref 24–37)

## 2015-04-17 LAB — POCT I-STAT, CHEM 8
BUN: 23 mg/dL — ABNORMAL HIGH (ref 6–20)
BUN: 22 mg/dL — ABNORMAL HIGH (ref 6–20)
BUN: 21 mg/dL — AB (ref 6–20)
BUN: 25 mg/dL — ABNORMAL HIGH (ref 6–20)
BUN: 26 mg/dL — ABNORMAL HIGH (ref 6–20)
BUN: 24 mg/dL — ABNORMAL HIGH (ref 6–20)
BUN: 17 mg/dL (ref 6–20)
CALCIUM ION: 1.06 mmol/L — AB (ref 1.13–1.30)
CALCIUM ION: 1.24 mmol/L (ref 1.13–1.30)
CALCIUM ION: 1.29 mmol/L (ref 1.13–1.30)
CHLORIDE: 107 mmol/L (ref 101–111)
CHLORIDE: 102 mmol/L (ref 101–111)
CHLORIDE: 99 mmol/L — AB (ref 101–111)
CREATININE: 1.1 mg/dL (ref 0.61–1.24)
CREATININE: 0.8 mg/dL (ref 0.61–1.24)
CREATININE: 1 mg/dL (ref 0.61–1.24)
Calcium, Ion: 1.32 mmol/L — ABNORMAL HIGH (ref 1.13–1.30)
Calcium, Ion: 1.34 mmol/L — ABNORMAL HIGH (ref 1.13–1.30)
Calcium, Ion: 1.11 mmol/L — ABNORMAL LOW (ref 1.13–1.30)
Calcium, Ion: 1.16 mmol/L (ref 1.13–1.30)
Chloride: 106 mmol/L (ref 101–111)
Chloride: 100 mmol/L — ABNORMAL LOW (ref 101–111)
Chloride: 103 mmol/L (ref 101–111)
Chloride: 112 mmol/L — ABNORMAL HIGH (ref 101–111)
Creatinine, Ser: 1 mg/dL (ref 0.61–1.24)
Creatinine, Ser: 1.1 mg/dL (ref 0.61–1.24)
Creatinine, Ser: 1 mg/dL (ref 0.61–1.24)
Creatinine, Ser: 1.1 mg/dL (ref 0.61–1.24)
GLUCOSE: 245 mg/dL — AB (ref 65–99)
GLUCOSE: 133 mg/dL — AB (ref 65–99)
Glucose, Bld: 139 mg/dL — ABNORMAL HIGH (ref 65–99)
Glucose, Bld: 123 mg/dL — ABNORMAL HIGH (ref 65–99)
Glucose, Bld: 109 mg/dL — ABNORMAL HIGH (ref 65–99)
Glucose, Bld: 227 mg/dL — ABNORMAL HIGH (ref 65–99)
Glucose, Bld: 153 mg/dL — ABNORMAL HIGH (ref 65–99)
HCT: 30 % — ABNORMAL LOW (ref 39.0–52.0)
HCT: 29 % — ABNORMAL LOW (ref 39.0–52.0)
HCT: 31 % — ABNORMAL LOW (ref 39.0–52.0)
HCT: 29 % — ABNORMAL LOW (ref 39.0–52.0)
HCT: 36 % — ABNORMAL LOW (ref 39.0–52.0)
HEMATOCRIT: 42 % (ref 39.0–52.0)
HEMATOCRIT: 39 % (ref 39.0–52.0)
HEMOGLOBIN: 10.2 g/dL — AB (ref 13.0–17.0)
HEMOGLOBIN: 12.2 g/dL — AB (ref 13.0–17.0)
Hemoglobin: 14.3 g/dL (ref 13.0–17.0)
Hemoglobin: 13.3 g/dL (ref 13.0–17.0)
Hemoglobin: 10.5 g/dL — ABNORMAL LOW (ref 13.0–17.0)
Hemoglobin: 9.9 g/dL — ABNORMAL LOW (ref 13.0–17.0)
Hemoglobin: 9.9 g/dL — ABNORMAL LOW (ref 13.0–17.0)
POTASSIUM: 4.2 mmol/L (ref 3.5–5.1)
POTASSIUM: 3.6 mmol/L (ref 3.5–5.1)
POTASSIUM: 4.9 mmol/L (ref 3.5–5.1)
Potassium: 4.2 mmol/L (ref 3.5–5.1)
Potassium: 4.4 mmol/L (ref 3.5–5.1)
Potassium: 4.6 mmol/L (ref 3.5–5.1)
Potassium: 3.7 mmol/L (ref 3.5–5.1)
SODIUM: 140 mmol/L (ref 135–145)
SODIUM: 132 mmol/L — AB (ref 135–145)
Sodium: 140 mmol/L (ref 135–145)
Sodium: 136 mmol/L (ref 135–145)
Sodium: 135 mmol/L (ref 135–145)
Sodium: 136 mmol/L (ref 135–145)
Sodium: 137 mmol/L (ref 135–145)
TCO2: 24 mmol/L (ref 0–100)
TCO2: 23 mmol/L (ref 0–100)
TCO2: 24 mmol/L (ref 0–100)
TCO2: 24 mmol/L (ref 0–100)
TCO2: 24 mmol/L (ref 0–100)
TCO2: 21 mmol/L (ref 0–100)
TCO2: 23 mmol/L (ref 0–100)

## 2015-04-17 LAB — CREATININE, SERUM
CREATININE: 1.34 mg/dL — AB (ref 0.61–1.24)
GFR calc non Af Amer: 51 mL/min — ABNORMAL LOW (ref >60)
GFR, EST AFRICAN AMERICAN: 59 mL/min — AB (ref >60)

## 2015-04-17 LAB — POCT I-STAT 4, (NA,K, GLUC, HGB,HCT)
Glucose, Bld: 135 mg/dL — ABNORMAL HIGH (ref 65–99)
HCT: 39 % (ref 39.0–52.0)
Hemoglobin: 13.3 g/dL (ref 13.0–17.0)
Potassium: 4.5 mmol/L (ref 3.5–5.1)
SODIUM: 142 mmol/L (ref 135–145)

## 2015-04-17 LAB — CBC
HCT: 38.6 % — ABNORMAL LOW (ref 39.0–52.0)
HCT: 37.3 % — ABNORMAL LOW (ref 39.0–52.0)
HEMOGLOBIN: 13.7 g/dL (ref 13.0–17.0)
HEMOGLOBIN: 12.7 g/dL — AB (ref 13.0–17.0)
MCH: 32.5 pg (ref 26.0–34.0)
MCH: 31.2 pg (ref 26.0–34.0)
MCHC: 35.5 g/dL (ref 30.0–36.0)
MCHC: 34 g/dL (ref 30.0–36.0)
MCV: 91.7 fL (ref 78.0–100.0)
MCV: 91.6 fL (ref 78.0–100.0)
PLATELETS: 66 10*3/uL — AB (ref 150–400)
Platelets: 87 10*3/uL — ABNORMAL LOW (ref 150–400)
RBC: 4.21 MIL/uL — ABNORMAL LOW (ref 4.22–5.81)
RBC: 4.07 MIL/uL — AB (ref 4.22–5.81)
RDW: 12.8 % (ref 11.5–15.5)
RDW: 12.9 % (ref 11.5–15.5)
WBC: 16.3 10*3/uL — AB (ref 4.0–10.5)
WBC: 12.7 10*3/uL — ABNORMAL HIGH (ref 4.0–10.5)

## 2015-04-17 LAB — PROTIME-INR
INR: 1.53 — ABNORMAL HIGH (ref 0.00–1.49)
Prothrombin Time: 18.5 seconds — ABNORMAL HIGH (ref 11.6–15.2)

## 2015-04-17 LAB — MAGNESIUM: Magnesium: 2.6 mg/dL — ABNORMAL HIGH (ref 1.7–2.4)

## 2015-04-17 LAB — POCT I-STAT GLUCOSE
GLUCOSE: 178 mg/dL — AB (ref 65–99)
Operator id: 3406

## 2015-04-17 LAB — HEMOGLOBIN AND HEMATOCRIT, BLOOD
HCT: 29.9 % — ABNORMAL LOW (ref 39.0–52.0)
Hemoglobin: 10.2 g/dL — ABNORMAL LOW (ref 13.0–17.0)

## 2015-04-17 LAB — PREPARE RBC (CROSSMATCH)

## 2015-04-17 LAB — PLATELET COUNT: Platelets: 129 10*3/uL — ABNORMAL LOW (ref 150–400)

## 2015-04-17 SURGERY — BENTALL PROCEDURE
Anesthesia: General | Site: Chest

## 2015-04-17 MED ORDER — EPHEDRINE SULFATE 50 MG/ML IJ SOLN
INTRAMUSCULAR | Status: DC | PRN
  Administered 2015-04-17: 10 mg via INTRAVENOUS

## 2015-04-17 MED ORDER — ACETAMINOPHEN 500 MG PO TABS
1000.0000 mg | ORAL_TABLET | Freq: Four times a day (QID) | ORAL | Status: AC
  Administered 2015-04-19 (×2): 1000 mg via ORAL
  Filled 2015-04-17 (×18): qty 2

## 2015-04-17 MED ORDER — SODIUM CHLORIDE 0.9 % IV SOLN: 250.0000 mL | INTRAVENOUS | Status: DC

## 2015-04-17 MED ORDER — LACTATED RINGERS IV SOLN: INTRAVENOUS | Status: DC

## 2015-04-17 MED ORDER — VECURONIUM BROMIDE 10 MG IV SOLR
INTRAVENOUS | Status: DC | PRN
  Administered 2015-04-17: 6 mg via INTRAVENOUS
  Administered 2015-04-17: 2 mg via INTRAVENOUS
  Administered 2015-04-17: 4 mg via INTRAVENOUS

## 2015-04-17 MED ORDER — FAMOTIDINE IN NACL 20-0.9 MG/50ML-% IV SOLN
20.0000 mg | Freq: Two times a day (BID) | INTRAVENOUS | Status: AC
  Administered 2015-04-17 (×2): 20 mg via INTRAVENOUS
  Filled 2015-04-17: qty 50

## 2015-04-17 MED ORDER — DEXMEDETOMIDINE HCL IN NACL 200 MCG/50ML IV SOLN
0.0000 ug/kg/h | INTRAVENOUS | Status: DC
  Administered 2015-04-17: 0.7 ug/kg/h via INTRAVENOUS
  Filled 2015-04-17 (×2): qty 50

## 2015-04-17 MED ORDER — ONDANSETRON HCL 4 MG/2ML IJ SOLN: 4.0000 mg | Freq: Four times a day (QID) | INTRAMUSCULAR | Status: DC | PRN

## 2015-04-17 MED ORDER — LACTATED RINGERS IV SOLN
INTRAVENOUS | Status: DC | PRN
  Administered 2015-04-17: 08:00:00 via INTRAVENOUS

## 2015-04-17 MED ORDER — CALCIUM CHLORIDE 10 % IV SOLN
INTRAVENOUS | Status: DC | PRN
  Administered 2015-04-17: 500 mg via INTRAVENOUS
  Administered 2015-04-17: 250 mg via INTRAVENOUS
  Administered 2015-04-17: 500 mg via INTRAVENOUS

## 2015-04-17 MED ORDER — PRAVASTATIN SODIUM 20 MG PO TABS
20.0000 mg | ORAL_TABLET | Freq: Every day | ORAL | Status: DC
  Administered 2015-04-18 – 2015-04-19 (×2): 20 mg via ORAL
  Filled 2015-04-17 (×4): qty 1

## 2015-04-17 MED ORDER — FENTANYL CITRATE (PF) 100 MCG/2ML IJ SOLN
50.0000 ug | INTRAMUSCULAR | Status: DC | PRN
  Administered 2015-04-17: 100 ug via INTRAVENOUS

## 2015-04-17 MED ORDER — VECURONIUM BROMIDE 10 MG IV SOLR
INTRAVENOUS | Status: AC
  Filled 2015-04-17: qty 20

## 2015-04-17 MED ORDER — ACETAMINOPHEN 160 MG/5ML PO SOLN
1000.0000 mg | Freq: Four times a day (QID) | ORAL | Status: AC
Start: 2015-04-18 — End: 2015-04-22
  Administered 2015-04-18 – 2015-04-22 (×15): 1000 mg
  Filled 2015-04-17 (×15): qty 40.6

## 2015-04-17 MED ORDER — FENTANYL CITRATE (PF) 100 MCG/2ML IJ SOLN
50.0000 ug | INTRAMUSCULAR | Status: DC | PRN
  Administered 2015-04-18: 50 ug via INTRAVENOUS
  Filled 2015-04-17 (×2): qty 2

## 2015-04-17 MED ORDER — ACETAMINOPHEN 650 MG RE SUPP
650.0000 mg | Freq: Once | RECTAL | Status: AC
  Administered 2015-04-17: 650 mg via RECTAL

## 2015-04-17 MED ORDER — FENTANYL CITRATE (PF) 250 MCG/5ML IJ SOLN
INTRAMUSCULAR | Status: AC
  Filled 2015-04-17: qty 5

## 2015-04-17 MED ORDER — LIDOCAINE HCL (CARDIAC) 20 MG/ML IV SOLN
INTRAVENOUS | Status: DC | PRN
Start: 2015-04-17 — End: 2015-04-17
  Administered 2015-04-17: 80 mg via INTRAVENOUS

## 2015-04-17 MED ORDER — SODIUM CHLORIDE 0.9 % IV SOLN
250.0000 [IU] | INTRAVENOUS | Status: DC | PRN
  Administered 2015-04-17: 1 [IU]/h via INTRAVENOUS

## 2015-04-17 MED ORDER — OXYCODONE HCL 5 MG PO TABS: 5.0000 mg | ORAL_TABLET | ORAL | Status: DC | PRN

## 2015-04-17 MED ORDER — SODIUM CHLORIDE 0.9 % IJ SOLN
OROMUCOSAL | Status: DC | PRN
  Administered 2015-04-17 (×3): 4 mL via TOPICAL

## 2015-04-17 MED ORDER — TRAMADOL HCL 50 MG PO TABS: 50.0000 mg | ORAL_TABLET | ORAL | Status: DC | PRN

## 2015-04-17 MED ORDER — FENTANYL CITRATE (PF) 100 MCG/2ML IJ SOLN
INTRAMUSCULAR | Status: DC | PRN
  Administered 2015-04-17: 50 ug via INTRAVENOUS
  Administered 2015-04-17: 400 ug via INTRAVENOUS
  Administered 2015-04-17: 250 ug via INTRAVENOUS
  Administered 2015-04-17: 50 ug via INTRAVENOUS
  Administered 2015-04-17: 500 ug via INTRAVENOUS
  Administered 2015-04-17: 250 ug via INTRAVENOUS

## 2015-04-17 MED ORDER — ALBUMIN HUMAN 5 % IV SOLN
12.5000 g | Freq: Once | INTRAVENOUS | Status: AC
  Administered 2015-04-17: 12.5 g via INTRAVENOUS

## 2015-04-17 MED ORDER — ALBUMIN HUMAN 5 % IV SOLN
250.0000 mL | INTRAVENOUS | Status: AC | PRN
  Administered 2015-04-17 (×4): 250 mL via INTRAVENOUS
  Filled 2015-04-17 (×2): qty 250

## 2015-04-17 MED ORDER — HEMOSTATIC AGENTS (NO CHARGE) OPTIME
TOPICAL | Status: DC | PRN
  Administered 2015-04-17: 1 via TOPICAL

## 2015-04-17 MED ORDER — SODIUM CHLORIDE 0.9 % IV SOLN: Freq: Once | INTRAVENOUS | Status: DC

## 2015-04-17 MED ORDER — MIDAZOLAM HCL 2 MG/2ML IJ SOLN: 2.0000 mg | INTRAMUSCULAR | Status: DC | PRN

## 2015-04-17 MED ORDER — DEXMEDETOMIDINE HCL IN NACL 200 MCG/50ML IV SOLN
INTRAVENOUS | Status: DC | PRN
  Administered 2015-04-17: .3 ug/kg/h via INTRAVENOUS

## 2015-04-17 MED ORDER — DEXTROSE 5 % IV SOLN
0.0000 ug/min | INTRAVENOUS | Status: DC
  Filled 2015-04-17: qty 2

## 2015-04-17 MED ORDER — ASPIRIN EC 325 MG PO TBEC
325.0000 mg | DELAYED_RELEASE_TABLET | Freq: Every day | ORAL | Status: DC
  Administered 2015-04-20: 325 mg via ORAL
  Filled 2015-04-17 (×7): qty 1

## 2015-04-17 MED ORDER — SODIUM CHLORIDE 0.9 % IV SOLN
10.0000 g | INTRAVENOUS | Status: DC | PRN
  Administered 2015-04-17: 5 g via INTRAVENOUS

## 2015-04-17 MED ORDER — PROPOFOL 10 MG/ML IV BOLUS
INTRAVENOUS | Status: AC
  Filled 2015-04-17: qty 20

## 2015-04-17 MED ORDER — ACETAMINOPHEN 160 MG/5ML PO SOLN: 650.0000 mg | Freq: Once | ORAL | Status: AC

## 2015-04-17 MED ORDER — ALBUMIN HUMAN 5 % IV SOLN
INTRAVENOUS | Status: AC
  Filled 2015-04-17: qty 250

## 2015-04-17 MED ORDER — LIDOCAINE HCL (CARDIAC) 20 MG/ML IV SOLN
INTRAVENOUS | Status: AC
  Filled 2015-04-17: qty 5

## 2015-04-17 MED ORDER — LACTATED RINGERS IV SOLN
INTRAVENOUS | Status: DC
  Administered 2015-04-17: 10 mL via INTRAVENOUS

## 2015-04-17 MED ORDER — ALBUMIN HUMAN 5 % IV SOLN
INTRAVENOUS | Status: DC | PRN
  Administered 2015-04-17 (×2): via INTRAVENOUS

## 2015-04-17 MED ORDER — ROCURONIUM BROMIDE 100 MG/10ML IV SOLN
INTRAVENOUS | Status: DC | PRN
  Administered 2015-04-17: 50 mg via INTRAVENOUS

## 2015-04-17 MED ORDER — SODIUM CHLORIDE 0.9 % IV SOLN
INTRAVENOUS | Status: DC
  Administered 2015-04-22: 12:00:00 via INTRAVENOUS

## 2015-04-17 MED ORDER — EPHEDRINE SULFATE 50 MG/ML IJ SOLN
INTRAMUSCULAR | Status: AC
  Filled 2015-04-17: qty 1

## 2015-04-17 MED ORDER — DOPAMINE-DEXTROSE 3.2-5 MG/ML-% IV SOLN
0.0000 ug/kg/min | INTRAVENOUS | Status: AC
Start: 2015-04-17 — End: 2015-04-18

## 2015-04-17 MED ORDER — BISACODYL 5 MG PO TBEC: 10.0000 mg | DELAYED_RELEASE_TABLET | Freq: Every day | ORAL | Status: DC

## 2015-04-17 MED ORDER — POTASSIUM CHLORIDE 10 MEQ/50ML IV SOLN: 10.0000 meq | INTRAVENOUS | Status: AC

## 2015-04-17 MED ORDER — NITROGLYCERIN IN D5W 200-5 MCG/ML-% IV SOLN: 0.0000 ug/min | INTRAVENOUS | Status: DC

## 2015-04-17 MED ORDER — LACTATED RINGERS IV SOLN
INTRAVENOUS | Status: DC | PRN
  Administered 2015-04-17 (×2): via INTRAVENOUS

## 2015-04-17 MED ORDER — MIDAZOLAM HCL 5 MG/5ML IJ SOLN
INTRAMUSCULAR | Status: DC | PRN
  Administered 2015-04-17 (×4): 1 mg via INTRAVENOUS

## 2015-04-17 MED ORDER — EPINEPHRINE HCL 1 MG/ML IJ SOLN
0.0000 ug/min | INTRAVENOUS | Status: AC
  Filled 2015-04-17: qty 4

## 2015-04-17 MED ORDER — METOPROLOL TARTRATE 1 MG/ML IV SOLN: 2.5000 mg | INTRAVENOUS | Status: DC | PRN

## 2015-04-17 MED ORDER — FENOFIBRATE 54 MG PO TABS
54.0000 mg | ORAL_TABLET | Freq: Every day | ORAL | Status: DC
  Administered 2015-04-19 – 2015-04-20 (×2): 54 mg via ORAL
  Filled 2015-04-17 (×3): qty 1

## 2015-04-17 MED ORDER — CEFUROXIME SODIUM 1.5 G IJ SOLR
1.5000 g | Freq: Two times a day (BID) | INTRAMUSCULAR | Status: AC
  Administered 2015-04-18 – 2015-04-19 (×4): 1.5 g via INTRAVENOUS
  Filled 2015-04-17 (×4): qty 1.5

## 2015-04-17 MED ORDER — TAMSULOSIN HCL 0.4 MG PO CAPS
0.4000 mg | ORAL_CAPSULE | Freq: Every day | ORAL | Status: DC
  Administered 2015-04-19: 0.4 mg via ORAL
  Filled 2015-04-17 (×4): qty 1

## 2015-04-17 MED ORDER — VANCOMYCIN HCL 1000 MG IV SOLR
1000.0000 mg | INTRAVENOUS | Status: DC | PRN
  Administered 2015-04-17: 1500 mg via INTRAVENOUS

## 2015-04-17 MED ORDER — PHENYLEPHRINE HCL 10 MG/ML IJ SOLN
10.0000 mg | INTRAVENOUS | Status: DC | PRN
  Administered 2015-04-17: 50 ug/min via INTRAVENOUS

## 2015-04-17 MED ORDER — METOPROLOL TARTRATE 25 MG/10 ML ORAL SUSPENSION
12.5000 mg | Freq: Two times a day (BID) | ORAL | Status: DC
  Administered 2015-04-19 (×2): 12.5 mg
  Filled 2015-04-17 (×7): qty 5

## 2015-04-17 MED ORDER — NOREPINEPHRINE BITARTRATE 1 MG/ML IV SOLN
0.0000 ug/min | INTRAVENOUS | Status: AC
  Filled 2015-04-17: qty 16

## 2015-04-17 MED ORDER — MORPHINE SULFATE 2 MG/ML IJ SOLN
1.0000 mg | INTRAMUSCULAR | Status: AC | PRN
  Administered 2015-04-17: 2 mg via INTRAVENOUS
  Filled 2015-04-17: qty 1

## 2015-04-17 MED ORDER — MIDAZOLAM HCL 2 MG/2ML IJ SOLN
INTRAMUSCULAR | Status: AC
  Filled 2015-04-17: qty 2

## 2015-04-17 MED ORDER — VANCOMYCIN HCL IN DEXTROSE 1-5 GM/200ML-% IV SOLN
1000.0000 mg | Freq: Once | INTRAVENOUS | Status: AC
  Administered 2015-04-17: 1000 mg via INTRAVENOUS
  Filled 2015-04-17: qty 200

## 2015-04-17 MED ORDER — CALCIUM CHLORIDE 10 % IV SOLN
1.0000 g | Freq: Once | INTRAVENOUS | Status: AC
  Administered 2015-04-17: 1 g via INTRAVENOUS
  Filled 2015-04-17: qty 10

## 2015-04-17 MED ORDER — SODIUM CHLORIDE 0.9 % IV SOLN
INTRAVENOUS | Status: AC
  Filled 2015-04-17 (×2): qty 2.5

## 2015-04-17 MED ORDER — SODIUM CHLORIDE 0.9 % IJ SOLN: 3.0000 mL | INTRAMUSCULAR | Status: DC | PRN

## 2015-04-17 MED ORDER — ROCURONIUM BROMIDE 50 MG/5ML IV SOLN
INTRAVENOUS | Status: AC
  Filled 2015-04-17: qty 1

## 2015-04-17 MED ORDER — NOREPINEPHRINE BITARTRATE 1 MG/ML IV SOLN
0.0000 ug/min | INTRAVENOUS | Status: AC
  Administered 2015-04-17: 4 ug/min via INTRAVENOUS
  Filled 2015-04-17: qty 16

## 2015-04-17 MED ORDER — PROPOFOL 10 MG/ML IV BOLUS
INTRAVENOUS | Status: DC | PRN
  Administered 2015-04-17: 50 mg via INTRAVENOUS
  Administered 2015-04-17: 100 mg via INTRAVENOUS
  Administered 2015-04-17: 150 mg via INTRAVENOUS

## 2015-04-17 MED ORDER — PANTOPRAZOLE SODIUM 40 MG PO TBEC: 40.0000 mg | DELAYED_RELEASE_TABLET | Freq: Every day | ORAL | Status: DC

## 2015-04-17 MED ORDER — SODIUM BICARBONATE 8.4 % IV SOLN
INTRAVENOUS | Status: DC | PRN
  Administered 2015-04-17: 50 meq via INTRAVENOUS

## 2015-04-17 MED ORDER — PROTAMINE SULFATE 10 MG/ML IV SOLN
INTRAVENOUS | Status: DC | PRN
Start: 2015-04-17 — End: 2015-04-17
  Administered 2015-04-17: 220 mg via INTRAVENOUS

## 2015-04-17 MED ORDER — CHLORHEXIDINE GLUCONATE 4 % EX LIQD: 30.0000 mL | CUTANEOUS | Status: DC

## 2015-04-17 MED ORDER — SODIUM CHLORIDE 0.45 % IV SOLN
INTRAVENOUS | Status: DC | PRN
  Administered 2015-04-17: 20 mL via INTRAVENOUS

## 2015-04-17 MED ORDER — ONDANSETRON HCL 4 MG/2ML IJ SOLN
INTRAMUSCULAR | Status: AC
  Filled 2015-04-17: qty 2

## 2015-04-17 MED ORDER — SODIUM CHLORIDE 0.9 % IJ SOLN
INTRAMUSCULAR | Status: AC
  Filled 2015-04-17: qty 10

## 2015-04-17 MED ORDER — HEPARIN SODIUM (PORCINE) 1000 UNIT/ML IJ SOLN
INTRAMUSCULAR | Status: DC | PRN
  Administered 2015-04-17: 23000 [IU] via INTRAVENOUS
  Administered 2015-04-17: 5000 [IU] via INTRAVENOUS

## 2015-04-17 MED ORDER — LACTATED RINGERS IV SOLN: 500.0000 mL | Freq: Once | INTRAVENOUS | Status: AC | PRN

## 2015-04-17 MED ORDER — MAGNESIUM SULFATE 4 GM/100ML IV SOLN
4.0000 g | Freq: Once | INTRAVENOUS | Status: AC
  Administered 2015-04-17: 4 g via INTRAVENOUS
  Filled 2015-04-17: qty 100

## 2015-04-17 MED ORDER — INSULIN REGULAR BOLUS VIA INFUSION
0.0000 [IU] | Freq: Three times a day (TID) | INTRAVENOUS | Status: AC
  Filled 2015-04-17: qty 10

## 2015-04-17 MED ORDER — METOPROLOL TARTRATE 12.5 MG HALF TABLET
12.5000 mg | ORAL_TABLET | Freq: Two times a day (BID) | ORAL | Status: DC
  Filled 2015-04-17 (×7): qty 1

## 2015-04-17 MED ORDER — PHENYLEPHRINE HCL 10 MG/ML IJ SOLN
INTRAMUSCULAR | Status: AC
  Filled 2015-04-17: qty 2

## 2015-04-17 MED ORDER — POTASSIUM CHLORIDE 10 MEQ/50ML IV SOLN
10.0000 meq | INTRAVENOUS | Status: AC | PRN
  Administered 2015-04-17 (×3): 10 meq via INTRAVENOUS

## 2015-04-17 MED ORDER — CETYLPYRIDINIUM CHLORIDE 0.05 % MT LIQD
7.0000 mL | Freq: Four times a day (QID) | OROMUCOSAL | Status: DC
Start: 2015-04-18 — End: 2015-04-21
  Administered 2015-04-18 – 2015-04-21 (×14): 7 mL via OROMUCOSAL

## 2015-04-17 MED ORDER — SODIUM CHLORIDE 0.9 % IR SOLN
Status: DC | PRN
  Administered 2015-04-17 (×2): 1000 mL

## 2015-04-17 MED ORDER — HEMOSTATIC AGENTS (NO CHARGE) OPTIME
TOPICAL | Status: DC | PRN
  Administered 2015-04-17 (×4): 1 via TOPICAL

## 2015-04-17 MED ORDER — BISACODYL 10 MG RE SUPP
10.0000 mg | Freq: Every day | RECTAL | Status: DC
  Administered 2015-04-18 – 2015-04-24 (×4): 10 mg via RECTAL
  Filled 2015-04-17 (×4): qty 1

## 2015-04-17 MED ORDER — ASPIRIN 81 MG PO CHEW
324.0000 mg | CHEWABLE_TABLET | Freq: Every day | ORAL | Status: DC
  Administered 2015-04-19 – 2015-04-24 (×5): 324 mg
  Filled 2015-04-17 (×6): qty 4

## 2015-04-17 MED ORDER — PHENYLEPHRINE 40 MCG/ML (10ML) SYRINGE FOR IV PUSH (FOR BLOOD PRESSURE SUPPORT)
PREFILLED_SYRINGE | INTRAVENOUS | Status: AC
  Filled 2015-04-17: qty 10

## 2015-04-17 MED ORDER — DOCUSATE SODIUM 100 MG PO CAPS
200.0000 mg | ORAL_CAPSULE | Freq: Every day | ORAL | Status: DC
  Filled 2015-04-17: qty 2

## 2015-04-17 MED ORDER — SODIUM CHLORIDE 0.9 % IJ SOLN
3.0000 mL | Freq: Two times a day (BID) | INTRAMUSCULAR | Status: DC
  Administered 2015-04-18 – 2015-04-23 (×10): 3 mL via INTRAVENOUS

## 2015-04-17 MED ORDER — ALBUMIN HUMAN 5 % IV SOLN
INTRAVENOUS | Status: AC
  Administered 2015-04-17: 12.5 g via INTRAVENOUS
  Filled 2015-04-17: qty 250

## 2015-04-17 MED ORDER — CHLORHEXIDINE GLUCONATE 0.12 % MT SOLN
15.0000 mL | Freq: Two times a day (BID) | OROMUCOSAL | Status: DC
  Administered 2015-04-17 – 2015-04-21 (×8): 15 mL via OROMUCOSAL
  Filled 2015-04-17 (×8): qty 15

## 2015-04-17 MED FILL — Lidocaine HCl IV Inj 20 MG/ML: INTRAVENOUS | Qty: 5 | Status: AC

## 2015-04-17 MED FILL — Electrolyte-R (PH 7.4) Solution: INTRAVENOUS | Qty: 7000 | Status: AC

## 2015-04-17 MED FILL — Sodium Bicarbonate IV Soln 8.4%: INTRAVENOUS | Qty: 100 | Status: AC

## 2015-04-17 MED FILL — Sodium Chloride IV Soln 0.9%: INTRAVENOUS | Qty: 4000 | Status: AC

## 2015-04-17 MED FILL — Heparin Sodium (Porcine) Inj 1000 Unit/ML: INTRAMUSCULAR | Qty: 20 | Status: AC

## 2015-04-17 MED FILL — Mannitol IV Soln 20%: INTRAVENOUS | Qty: 500 | Status: AC

## 2015-04-17 SURGICAL SUPPLY — 103 items
ADAPTER CARDIO PERF ANTE/RETRO (ADAPTER) ×4 IMPLANT
APPLICATOR COTTON TIP 6IN STRL (MISCELLANEOUS) IMPLANT
APPLICATOR TIP COSEAL (VASCULAR PRODUCTS) ×8 IMPLANT
BAG DECANTER FOR FLEXI CONT (MISCELLANEOUS) ×4 IMPLANT
BLADE STERNUM SYSTEM 6 (BLADE) ×4 IMPLANT
BLADE SURG 15 STRL LF DISP TIS (BLADE) ×2 IMPLANT
BLADE SURG 15 STRL SS (BLADE) ×2
CANISTER SUCTION 2500CC (MISCELLANEOUS) ×4 IMPLANT
CANNULA AORTIC ROOT 9FR (CANNULA) ×4 IMPLANT
CANNULA EZ GLIDE AORTIC 21FR (CANNULA) IMPLANT
CANNULA GRAFT 8MMX50CM (Graft) ×4 IMPLANT
CANNULA GUNDRY RCSP 15FR (MISCELLANEOUS) ×4 IMPLANT
CATH CPB KIT HENDRICKSON (MISCELLANEOUS) ×4 IMPLANT
CATH HEART VENT LEFT (CATHETERS) ×4 IMPLANT
CATH ROBINSON RED A/P 18FR (CATHETERS) ×8 IMPLANT
CATH THORACIC 36FR RT ANG (CATHETERS) ×8 IMPLANT
CAUTERY EYE LOW TEMP 1300F FIN (OPHTHALMIC RELATED) IMPLANT
CAUTERY SURG HI TEMP FINE TIP (MISCELLANEOUS) ×4 IMPLANT
CLIP FOGARTY SPRING 6M (CLIP) IMPLANT
CONT SPEC 4OZ CLIKSEAL STRL BL (MISCELLANEOUS) ×8 IMPLANT
CRADLE DONUT ADULT HEAD (MISCELLANEOUS) ×4 IMPLANT
DRAIN CHANNEL 32F RND 10.7 FF (WOUND CARE) ×4 IMPLANT
DRAPE SLUSH/WARMER DISC (DRAPES) ×4 IMPLANT
DRSG AQUACEL AG ADV 3.5X14 (GAUZE/BANDAGES/DRESSINGS) ×4 IMPLANT
DRSG COVADERM 4X10 (GAUZE/BANDAGES/DRESSINGS) ×4 IMPLANT
DRSG COVADERM 4X14 (GAUZE/BANDAGES/DRESSINGS) ×4 IMPLANT
ELECT REM PT RETURN 9FT ADLT (ELECTROSURGICAL) ×8
ELECTRODE REM PT RTRN 9FT ADLT (ELECTROSURGICAL) ×4 IMPLANT
GAUZE SPONGE 4X4 12PLY STRL (GAUZE/BANDAGES/DRESSINGS) ×4 IMPLANT
GLOVE EUDERMIC 7 POWDERFREE (GLOVE) ×12 IMPLANT
GLOVE SURG SIGNA 7.5 PF LTX (GLOVE) ×12 IMPLANT
GOWN STRL REUS W/ TWL LRG LVL3 (GOWN DISPOSABLE) ×10 IMPLANT
GOWN STRL REUS W/ TWL XL LVL3 (GOWN DISPOSABLE) ×2 IMPLANT
GOWN STRL REUS W/TWL LRG LVL3 (GOWN DISPOSABLE) ×10
GOWN STRL REUS W/TWL XL LVL3 (GOWN DISPOSABLE) ×2
GRAFT GELWEAVE INPREG 30X30CM (Vascular Products) ×4 IMPLANT
HANDLE STAPLE ENDO GIA SHORT (STAPLE) ×2
HEMOSTAT POWDER SURGIFOAM 1G (HEMOSTASIS) ×12 IMPLANT
HEMOSTAT SURGICEL 2X14 (HEMOSTASIS) ×8 IMPLANT
INSERT FOGARTY SM (MISCELLANEOUS) ×8 IMPLANT
INSERT FOGARTY XLG (MISCELLANEOUS) ×4 IMPLANT
KIT BASIN OR (CUSTOM PROCEDURE TRAY) ×4 IMPLANT
KIT ROOM TURNOVER OR (KITS) ×4 IMPLANT
KIT SUCTION CATH 14FR (SUCTIONS) ×12 IMPLANT
LEAD PACING MYOCARDI (MISCELLANEOUS) ×4 IMPLANT
LOOP VESSEL MAXI BLUE (MISCELLANEOUS) ×4 IMPLANT
LOOP VESSEL SUPERMAXI WHITE (MISCELLANEOUS) ×8 IMPLANT
NEEDLE AORTIC AIR ASPIRATING (NEEDLE) ×4 IMPLANT
NS IRRIG 1000ML POUR BTL (IV SOLUTION) ×28 IMPLANT
PACK OPEN HEART (CUSTOM PROCEDURE TRAY) ×4 IMPLANT
PAD ARMBOARD 7.5X6 YLW CONV (MISCELLANEOUS) ×8 IMPLANT
RELOAD ENDO GIA 30 3.5 (STAPLE) ×4 IMPLANT
RELOAD GIA II 30 3.5 (ENDOMECHANICALS) ×4 IMPLANT
SEALANT SURG COSEAL 8ML (VASCULAR PRODUCTS) ×8 IMPLANT
SPONGE GAUZE 4X4 12PLY STER LF (GAUZE/BANDAGES/DRESSINGS) ×8 IMPLANT
STAPLER ENDO GIA 12MM SHORT (STAPLE) ×2 IMPLANT
SUT BONE WAX W31G (SUTURE) ×4 IMPLANT
SUT ETHIBON 2 0 V 52N 30 (SUTURE) IMPLANT
SUT ETHIBON EXCEL 2-0 V-5 (SUTURE) IMPLANT
SUT ETHIBOND 2 0 SH (SUTURE) ×2
SUT ETHIBOND 2 0 SH 36X2 (SUTURE) ×2 IMPLANT
SUT ETHIBOND 2 0 V4 (SUTURE) IMPLANT
SUT ETHIBOND 2 0V4 GREEN (SUTURE) IMPLANT
SUT ETHIBOND 4 0 RB 1 (SUTURE) ×12 IMPLANT
SUT ETHIBOND NAB MH 2-0 36IN (SUTURE) ×4 IMPLANT
SUT ETHIBOND V-5 VALVE (SUTURE) IMPLANT
SUT PROLENE 3 0 SH 1 (SUTURE) ×8 IMPLANT
SUT PROLENE 3 0 SH DA (SUTURE) ×4 IMPLANT
SUT PROLENE 4 0 RB 1 (SUTURE) ×12
SUT PROLENE 4 0 SH DA (SUTURE) ×24 IMPLANT
SUT PROLENE 4-0 RB1 .5 CRCL 36 (SUTURE) ×12 IMPLANT
SUT PROLENE 5 0 C 1 36 (SUTURE) ×20 IMPLANT
SUT PROLENE 6 0 C 1 30 (SUTURE) ×12 IMPLANT
SUT SILK  1 MH (SUTURE)
SUT SILK 1 MH (SUTURE) IMPLANT
SUT SILK 1 TIES 10X30 (SUTURE) IMPLANT
SUT SILK 2 0 (SUTURE)
SUT SILK 2 0 SH CR/8 (SUTURE) ×4 IMPLANT
SUT SILK 2-0 18XBRD TIE 12 (SUTURE) IMPLANT
SUT SILK 3 0 SH CR/8 (SUTURE) IMPLANT
SUT SILK 4 0 (SUTURE)
SUT SILK 4-0 18XBRD TIE 12 (SUTURE) IMPLANT
SUT STEEL 6MS V (SUTURE) IMPLANT
SUT STEEL SZ 6 DBL 3X14 BALL (SUTURE) IMPLANT
SUT TEM PAC WIRE 2 0 SH (SUTURE) IMPLANT
SUT VIC AB 1 CTX 36 (SUTURE) ×4
SUT VIC AB 1 CTX36XBRD ANBCTR (SUTURE) ×4 IMPLANT
SUT VIC AB 2-0 CTX 27 (SUTURE) IMPLANT
SUT VIC AB 3-0 SH 27 (SUTURE) ×2
SUT VIC AB 3-0 SH 27X BRD (SUTURE) ×2 IMPLANT
SUT VIC AB 3-0 X1 27 (SUTURE) ×4 IMPLANT
SUTURE E-PAK OPEN HEART (SUTURE) ×4 IMPLANT
SYR 10ML KIT SKIN ADHESIVE (MISCELLANEOUS) IMPLANT
SYSTEM SAHARA CHEST DRAIN ATS (WOUND CARE) ×4 IMPLANT
TAPE CLOTH SURG 4X10 WHT LF (GAUZE/BANDAGES/DRESSINGS) ×8 IMPLANT
TOWEL OR 17X24 6PK STRL BLUE (TOWEL DISPOSABLE) ×8 IMPLANT
TOWEL OR 17X26 10 PK STRL BLUE (TOWEL DISPOSABLE) ×8 IMPLANT
TRAY FOLEY IC TEMP SENS 14FR (CATHETERS) ×4 IMPLANT
TRAY FOLEY IC TEMP SENS 16FR (CATHETERS) ×4 IMPLANT
UNDERPAD 30X30 INCONTINENT (UNDERPADS AND DIAPERS) ×4 IMPLANT
VALVE AORTIC SZ 29 (Prosthesis & Implant Heart) ×4 IMPLANT
VENT LEFT HEART 12002 (CATHETERS) ×8
WATER STERILE IRR 1000ML POUR (IV SOLUTION) ×8 IMPLANT

## 2015-04-17 NOTE — OR Nursing (Signed)
1350 1 hr call made to SICU, 1410 45 min call made to SICU, 1442 20 min call made to SICU

## 2015-04-17 NOTE — Anesthesia Preprocedure Evaluation (Signed)
Anesthesia Evaluation   Patient awake    Reviewed: Allergy & Precautions, NPO status , Patient's Chart, lab work & pertinent test results  History of Anesthesia Complications (+) PONV  Airway Mallampati: I       Dental   Pulmonary asthma ,    Pulmonary exam normal       Cardiovascular hypertension, + Peripheral Vascular Disease Normal cardiovascular exam+ Valvular Problems/Murmurs AI     Neuro/Psych    GI/Hepatic hiatal hernia,   Endo/Other    Renal/GU      Musculoskeletal  (+) Arthritis -,   Abdominal   Peds  Hematology   Anesthesia Other Findings   Reproductive/Obstetrics                             Anesthesia Physical Anesthesia Plan  ASA: III  Anesthesia Plan: General   Post-op Pain Management:    Induction: Intravenous  Airway Management Planned: Oral ETT  Additional Equipment: Arterial line, CVP, PA Cath and TEE  Intra-op Plan:   Post-operative Plan: Post-operative intubation/ventilation  Informed Consent: I have reviewed the patients History and Physical, chart, labs and discussed the procedure including the risks, benefits and alternatives for the proposed anesthesia with the patient or authorized representative who has indicated his/her understanding and acceptance.     Plan Discussed with: CRNA, Anesthesiologist and Surgeon  Anesthesia Plan Comments:         Anesthesia Quick Evaluation

## 2015-04-17 NOTE — Progress Notes (Signed)
      [address]       [place] [address]             [phone]      Intubated, sedated  BP 100/68 mmHg  Pulse 91  Temp(Src) 97.9 F (36.6 C) (Oral)  Resp 14  Ht 6' (1.829 m)  Wt 189 lb (85.73 kg)  BMI 25.63 kg/m2  SpO2 100%  28/15 CI= 2.1   Intake/Output Summary (Last 24 hours) at 04/13/2015 1853 Last data filed at 04/16/2015 1803  Gross per 24 hour  Intake 7228.87 ml  Output   3850 ml  Net 3378.87 ml    Minimal CT output  Dropped pressure earlier, now off dopamine and epi- will resume dopamine, give albumin and calcium  Remo Lipps C. Roxan Hockey, MD [facility] [phone]

## 2015-04-17 NOTE — Anesthesia Procedure Notes (Signed)
Procedure Name: Intubation Date/Time: 04/27/2015 8:38 AM Performed by: Rebekah Chesterfield L Pre-anesthesia Checklist: Patient identified, Emergency Drugs available, Suction available, Patient being monitored and Timeout performed Patient Re-evaluated:Patient Re-evaluated prior to inductionOxygen Delivery Method: Circle system utilized Preoxygenation: Pre-oxygenation with 100% oxygen Intubation Type: IV induction and Cricoid Pressure applied Ventilation: Mask ventilation without difficulty Laryngoscope Size: Mac and 4 Grade View: Grade III Tube type: Oral Tube size: 8.0 mm Number of attempts: 1 Airway Equipment and Method: Stylet and Patient positioned with wedge pillow Placement Confirmation: ETT inserted through vocal cords under direct vision,  breath sounds checked- equal and bilateral and positive ETCO2 Secured at: 21 cm Tube secured with: Tape Dental Injury: Teeth and Oropharynx as per pre-operative assessment

## 2015-04-17 NOTE — H&P (View-Only) (Signed)
[address]       [place] [address]             [phone]       HPI: [name] returns to discuss the results of his recent studies and schedule surgery.  He is a 74 year old gentleman who was first found to have an a ascending and aortic root aneurysm in 2011. The aneurysm was 4.8 x 4.6 cm and he had mild to moderate AI as well.  I saw him in January of 2013 and recommended an MR in 6 months. The MR showed no significant change. After that he was followed by [facility], but says he never saw the same MD twice. He had a CT angiogram in August of 2015. He decided to come to [facility] for follow-up of the aneurysm.  I saw him last week and recommended that we repeat his CT angiogram as it had been nearly a year since his most recent study. He had that done last week and now returns to discuss the results.  He remains active and only gets SOB with heavy exertion. He can walk a mile at a steady pace without issues. He denies chest pain, pressure or tightness. He has recently noted several episodes of paroxysmal nocturnal dyspnea. He denies orthopnea and peripheral edema.  He had an echocardiogram and cardiac catheterization.   Current Outpatient Prescriptions  Medication Sig Dispense Refill  . aspirin EC 81 MG tablet Take 1 tablet (81 mg total) by mouth daily.    . Cholecalciferol (VITAMIN D PO) Take 400 Units by mouth daily.    . cyclobenzaprine (FLEXERIL) 10 MG tablet Take 5-10 mg by mouth at bedtime as needed for muscle spasms.    Marland Kitchen ERTACZO 2 % CREA Apply 1 application topically daily as needed (rash).     . fenofibrate micronized (LOFIBRA) 67 MG capsule Take 67 mg by mouth daily.    Marland Kitchen lovastatin (MEVACOR) 20 MG tablet Take 20 mg by mouth daily.  1  . meloxicam (MOBIC) 15 MG tablet Take 15 mg by mouth daily.  2  . methocarbamol (ROBAXIN) 500 MG tablet Take 500 mg by mouth 3 (three) times daily.  2  . Multiple Vitamin (MULTIVITAMIN) capsule Take  1 capsule by mouth daily.    . Tamsulosin HCl (FLOMAX) 0.4 MG CAPS Take 0.4 mg by mouth at bedtime.     No current facility-administered medications for this visit.    Physical Exam  Diagnostic Tests: I personally reviewed the studies listed below and concur with the official reports  Echocardiogram 04/01/2015 Study Conclusions  - Left ventricle: The cavity size was normal. There was moderate concentric hypertrophy. Systolic function was normal. The estimated ejection fraction was in the range of 50% to 55%. Wall motion was normal; there were no regional wall motion abnormalities. Due to first degree atrioventricular block, there was fusion of early and atrial contributions to ventricular filling. - Ventricular septum: Septal motion showed paradox. - Aortic valve: Valve area (VTI): 3.56 cm^2. Valve area (Vmean): 3.33 cm^2. - Aortic root: The aortic root was moderately dilated. - Ascending aorta: The ascending aorta was severely dilated. - Mitral valve: There was mild to moderate regurgitation directed centrally. - Left atrium: The atrium was mildly dilated.  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  Sanmiguel is a 74 year old gentleman with a 5.6 cm ascending aortic aneurysm and dilated aortic root. The aorta returns to a relatively normal size just beyond the takeoff of the innominate artery. The aneurysm has grown over the past year. His workup showed no significant disease in the coronary arteries. He had moderate aortic regurgitation and mild-to-moderate mitral regurgitation. His EF is normal.  I discussed the proposed operation with Mr. Quebedeaux and his sisters. They understand we will plan to replace the aortic root and ascending aorta. This would require cardiac  pulmonary bypass and in all likelihood a period of deep hypothermic circulatory arrest. I described the general nature of the procedure including the incisions to be used, the use of the heart-lung machine, the expected hospital stay, and the overall recovery. We discussed intraoperative decision-making regarding valve resuspension versus replacement. If replacement is required we would use a tissue valve.  I informed them of the indications, risks, benefits, and alternatives. He understands the risks include, but are not limited to, death, MI, stroke, other neurologic dysfunction, bleeding, need for transfusion, infection, renal failure, gastrointestinal complications, respiratory failure, as well as the possibility of other unforeseeable complications. I had discussed the possibility of heart block requiring permanent pacemaker placement at his last visit.  He understands and accepts the risks and agrees to proceed.  Plan:  Replacement of aortic root and ascending aorta (Bentall procedure) on Wednesday 05/01/2015  Melrose Nakayama, MD [facility] [phone]

## 2015-04-17 NOTE — Anesthesia Postprocedure Evaluation (Signed)
  Anesthesia Post-op Note  Patient: Levi Shepard  Procedure(s) Performed: Procedure(s): BENTALL PROCEDURE (N/A) TRANSESOPHAGEAL ECHOCARDIOGRAM (TEE) (N/A) AORTIC VALVE REPLACEMENT (AVR) (N/A)  Patient Location: SICU  Anesthesia Type:General  Level of Consciousness: sedated and Patient remains intubated per anesthesia plan  Airway and Oxygen Therapy: Patient remains intubated per anesthesia plan and Patient placed on Ventilator (see vital sign flow sheet for setting)  Post-op Pain: none  Post-op Assessment: Post-op Vital signs reviewed, Patient's Cardiovascular Status Stable, Respiratory Function Stable, Patent Airway, No signs of Nausea or vomiting and Pain level controlled              Post-op Vital Signs: stable  Last Vitals:  Filed Vitals:   05/05/2015 0651  BP: 172/76  Pulse: 77  Temp: 36.6 C  Resp: 20    Complications: No apparent anesthesia complications

## 2015-04-17 NOTE — Progress Notes (Signed)
Pt arrived from the OR with CRNA manually ventilating. Pt's sat is currently 95. BS's are diminished. Pt's ETT is a size 8.0 and arrived to his room measured at 23 @ the lip. RN is currently with pt and RT will continue to monitor.

## 2015-04-17 NOTE — Progress Notes (Signed)
  Echocardiogram Echocardiogram Transesophageal has been performed.  Diamond Nickel 04/22/2015, 9:54 AM

## 2015-04-17 NOTE — Brief Op Note (Addendum)
04/07/2015  1:46 PM  PATIENT:  Levi Shepard  74 y.o. male  PRE-OPERATIVE DIAGNOSIS:   AORTIC ROOT AND ASCENDING ANEURYSM   POST-OPERATIVE DIAGNOSIS:  AORTIC ROOT AND ASCENDING ANEURYSM   PROCEDURE:   REPLACEMENT OF AORTIC ROOT (BENTALL) AND ASCENDING AORTA (using moderate hypothermic circulatory arrest with antegrade cerebral perfusion)  30 mm Hemashield Gelweave graft  29 mm Medtronic Freestyle aortic root/tissue valve conduit (SN # [id number]  SURGEON:  Melrose Nakayama, MD  ASSISTANT: Suzzanne Cloud, PA-C  ANESTHESIA:   general  PATIENT CONDITION:  ICU - intubated and hemodynamically stable.  PRE-OPERATIVE WEIGHT: 86 kg   Aortic Valve Etiology   Aortic Insufficiency:  Moderate  Aortic Valve Disease:  No.  Aortic Stenosis:  No.  Etiology (Choose at least one and up to  5 etiologies):  Degenerative - Pure annular dilation without leaflet prolapse   Aortic Valve  Procedure Performed:  Replacement: Yes.  Bioprosthetic Valve. Implant Model Number:[id number], Size:29, Unique Device Identifier:[id number]  Repair/Reconstruction: Yes.  Root replacement with valved conduit (Bentall) Bioprosthetic Valve. Implant Model Number:[id number], Size:29, Unique Device Identifier:[id number]  Aortic Annular Enlargement: No.   Circulatory Arrest time= 50min Antegrade cerebral perfusion time= 30 min XC= 140 min CPB= 188 min

## 2015-04-17 NOTE — Transfer of Care (Signed)
Immediate Anesthesia Transfer of Care Note  Patient: Levi Shepard  Procedure(s) Performed: Procedure(s): BENTALL PROCEDURE (N/A) TRANSESOPHAGEAL ECHOCARDIOGRAM (TEE) (N/A) AORTIC VALVE REPLACEMENT (AVR) (N/A)  Patient Location: SICU  Anesthesia Type:General  Level of Consciousness: sedated, unresponsive and Patient remains intubated per anesthesia plan  Airway & Oxygen Therapy: Patient remains intubated per anesthesia plan and Patient placed on Ventilator (see vital sign flow sheet for setting)  Post-op Assessment: Report given to RN and Post -op Vital signs reviewed and stable  Post vital signs: Reviewed and stable  Last Vitals:  Filed Vitals:   04/12/2015 1520  BP: 99/55  Pulse: 91  Temp:   Resp: 14    Complications: No apparent anesthesia complications

## 2015-04-17 NOTE — Interval H&P Note (Signed)
History and Physical Interval Note:  04/14/2015 7:56 AM  Levi Shepard  has presented today for surgery, with the diagnosis of ASCENDING AORTIC ROOT ANEURYSM   The various methods of treatment have been discussed with the patient and family. After consideration of risks, benefits and other options for treatment, the patient has consented to  Procedure(s): BENTALL PROCEDURE (N/A) POSSIBLE AORTIC VALVE REPLACEMENT (AVR) (N/A) TRANSESOPHAGEAL ECHOCARDIOGRAM (TEE) (N/A) as a surgical intervention .  The patient's history has been reviewed, patient examined, no change in status, stable for surgery.  I have reviewed the patient's chart and labs.  Questions were answered to the patient's satisfaction.     Melrose Nakayama

## 2015-04-18 ENCOUNTER — Inpatient Hospital Stay (HOSPITAL_COMMUNITY): Payer: Medicare Other

## 2015-04-18 ENCOUNTER — Encounter (HOSPITAL_COMMUNITY): Payer: Self-pay | Admitting: Thoracic Surgery (Cardiothoracic Vascular Surgery)

## 2015-04-18 DIAGNOSIS — I63311 Cerebral infarction due to thrombosis of right middle cerebral artery: Secondary | ICD-10-CM

## 2015-04-18 LAB — TYPE AND SCREEN
ABO/RH(D): A POS
Antibody Screen: NEGATIVE
Unit division: 0
Unit division: 0

## 2015-04-18 LAB — POCT I-STAT, CHEM 8
BUN: 20 mg/dL (ref 6–20)
CREATININE: 0.9 mg/dL (ref 0.61–1.24)
Calcium, Ion: 1.29 mmol/L (ref 1.13–1.30)
Chloride: 104 mmol/L (ref 101–111)
Glucose, Bld: 139 mg/dL — ABNORMAL HIGH (ref 65–99)
HEMATOCRIT: 32 % — AB (ref 39.0–52.0)
Hemoglobin: 10.9 g/dL — ABNORMAL LOW (ref 13.0–17.0)
Potassium: 3.8 mmol/L (ref 3.5–5.1)
SODIUM: 141 mmol/L (ref 135–145)
TCO2: 21 mmol/L (ref 0–100)

## 2015-04-18 LAB — CBC
HCT: 34.6 % — ABNORMAL LOW (ref 39.0–52.0)
HEMATOCRIT: 32.7 % — AB (ref 39.0–52.0)
HEMOGLOBIN: 11.9 g/dL — AB (ref 13.0–17.0)
Hemoglobin: 11.1 g/dL — ABNORMAL LOW (ref 13.0–17.0)
MCH: 31.6 pg (ref 26.0–34.0)
MCH: 31.3 pg (ref 26.0–34.0)
MCHC: 34.4 g/dL (ref 30.0–36.0)
MCHC: 33.9 g/dL (ref 30.0–36.0)
MCV: 91.8 fL (ref 78.0–100.0)
MCV: 92.1 fL (ref 78.0–100.0)
Platelets: 71 10*3/uL — ABNORMAL LOW (ref 150–400)
Platelets: 72 10*3/uL — ABNORMAL LOW (ref 150–400)
RBC: 3.77 MIL/uL — AB (ref 4.22–5.81)
RBC: 3.55 MIL/uL — ABNORMAL LOW (ref 4.22–5.81)
RDW: 13 % (ref 11.5–15.5)
RDW: 13.2 % (ref 11.5–15.5)
WBC: 10.7 10*3/uL — AB (ref 4.0–10.5)
WBC: 10.1 10*3/uL (ref 4.0–10.5)

## 2015-04-18 LAB — GLUCOSE, CAPILLARY
GLUCOSE-CAPILLARY: 153 mg/dL — AB (ref 65–99)
GLUCOSE-CAPILLARY: 130 mg/dL — AB (ref 65–99)
GLUCOSE-CAPILLARY: 127 mg/dL — AB (ref 65–99)
GLUCOSE-CAPILLARY: 130 mg/dL — AB (ref 65–99)
GLUCOSE-CAPILLARY: 116 mg/dL — AB (ref 65–99)
GLUCOSE-CAPILLARY: 122 mg/dL — AB (ref 65–99)
Glucose-Capillary: 107 mg/dL — ABNORMAL HIGH (ref 65–99)
Glucose-Capillary: 121 mg/dL — ABNORMAL HIGH (ref 65–99)
Glucose-Capillary: 124 mg/dL — ABNORMAL HIGH (ref 65–99)
Glucose-Capillary: 139 mg/dL — ABNORMAL HIGH (ref 65–99)
Glucose-Capillary: 139 mg/dL — ABNORMAL HIGH (ref 65–99)
Glucose-Capillary: 149 mg/dL — ABNORMAL HIGH (ref 65–99)
Glucose-Capillary: 152 mg/dL — ABNORMAL HIGH (ref 65–99)
Glucose-Capillary: 165 mg/dL — ABNORMAL HIGH (ref 65–99)
Glucose-Capillary: 184 mg/dL — ABNORMAL HIGH (ref 65–99)

## 2015-04-18 LAB — BASIC METABOLIC PANEL
Anion gap: 8 (ref 5–15)
BUN: 16 mg/dL (ref 6–20)
CO2: 22 mmol/L (ref 22–32)
CREATININE: 1.23 mg/dL (ref 0.61–1.24)
Calcium: 8.9 mg/dL (ref 8.9–10.3)
Chloride: 107 mmol/L (ref 101–111)
GFR calc non Af Amer: 57 mL/min — ABNORMAL LOW (ref >60)
Glucose, Bld: 154 mg/dL — ABNORMAL HIGH (ref 65–99)
Potassium: 3.9 mmol/L (ref 3.5–5.1)
Sodium: 137 mmol/L (ref 135–145)

## 2015-04-18 LAB — POCT I-STAT 3, ART BLOOD GAS (G3+)
Acid-base deficit: 1 mmol/L (ref 0.0–2.0)
Bicarbonate: 22.6 mEq/L (ref 20.0–24.0)
O2 Saturation: 95 %
PCO2 ART: 32.5 mmHg — AB (ref 35.0–45.0)
PH ART: 7.447 (ref 7.350–7.450)
TCO2: 24 mmol/L (ref 0–100)
pO2, Arterial: 67 mmHg — ABNORMAL LOW (ref 80.0–100.0)

## 2015-04-18 LAB — PREPARE FRESH FROZEN PLASMA
Unit division: 0
Unit division: 0

## 2015-04-18 LAB — CREATININE, SERUM
Creatinine, Ser: 1.03 mg/dL (ref 0.61–1.24)
GFR calc Af Amer: >60 mL/min (ref >60)
GFR calc non Af Amer: >60 mL/min (ref >60)

## 2015-04-18 LAB — MAGNESIUM
Magnesium: 2.1 mg/dL (ref 1.7–2.4)
Magnesium: 1.9 mg/dL (ref 1.7–2.4)

## 2015-04-18 MED ORDER — FUROSEMIDE 10 MG/ML IJ SOLN
20.0000 mg | Freq: Once | INTRAMUSCULAR | Status: AC
  Administered 2015-04-18: 20 mg via INTRAVENOUS

## 2015-04-18 MED ORDER — INSULIN ASPART 100 UNIT/ML ~~LOC~~ SOLN
0.0000 [IU] | SUBCUTANEOUS | Status: DC
  Administered 2015-04-18 – 2015-04-24 (×12): 2 [IU] via SUBCUTANEOUS

## 2015-04-18 MED ORDER — INSULIN DETEMIR 100 UNIT/ML ~~LOC~~ SOLN
30.0000 [IU] | Freq: Once | SUBCUTANEOUS | Status: AC
  Administered 2015-04-18: 30 [IU] via SUBCUTANEOUS
  Filled 2015-04-18: qty 0.3

## 2015-04-18 MED ORDER — POTASSIUM CHLORIDE 10 MEQ/50ML IV SOLN
10.0000 meq | INTRAVENOUS | Status: AC
  Administered 2015-04-18 (×3): 10 meq via INTRAVENOUS

## 2015-04-18 MED ORDER — INSULIN DETEMIR 100 UNIT/ML ~~LOC~~ SOLN
30.0000 [IU] | Freq: Every day | SUBCUTANEOUS | Status: DC
  Filled 2015-04-18: qty 0.3

## 2015-04-18 NOTE — Progress Notes (Addendum)
Following is the detailed account of progression of patient awakening from surgery/ identification of CVA symptoms:  2205: Patient makes first effort to open eyes after surgery.  RN assisted eyelid opening.  Eye gaze deviated to R.  No motor movement of any limbs or following of commands.  0015: Patient moving bilateral toes to command. Patient not yet moving arms on either side.  0225: Patient unable to squeeze L hand, unable to move L hand/arm on further questioning.  Pt attempted to use R hand to squeeze L hand.  0245: Dr. Roxan Hockey notified.  No new orders received at that time.  OK to continue wean to extubate as tolerated with Rapid Wean parameters.  0315: Rapid Response called.  No ability to initiate Code Stroke because last seen normal was prior to surgery on 7/13 (greater than 12 hours prior)  0347: NIH Stroke Scale = 17 (see details in nursing assessment flowsheet)

## 2015-04-18 NOTE — Progress Notes (Signed)
Initial Nutrition Assessment  DOCUMENTATION CODES:   Not applicable  INTERVENTION:    If TF started, recommend Vital AF 1.2 formula -- initiate at 25 ml/hr and increase by 10 ml every 4 hours to goal rate of 65 ml/hr   Total TF regimen to provide 1872 kcals, 117 gm protein, 1265 ml of free water  NUTRITION DIAGNOSIS:   Inadequate oral intake related to inability to eat (s/p CVA) as evidenced by NPO status  GOAL:   Patient will meet greater than or equal to 90% of their needs  MONITOR:   Diet advancement, PO intake, Labs, Weight trends, I & O's  REASON FOR ASSESSMENT:   Low Braden  ASSESSMENT:  74 y.o. Male admitted for replacement aortic root and ascending aorta 04/08/2015. Post-op developed s/s of stroke. 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  RD unable to interview pt at time of visit.  No family present at bedside.  S/p swallow evaluation today.  Pt presenting with neurogenic dysphagia - SLP recommending NPO status at this time.   Low braden score places patient at risk for skin breakdown.  RD unable to complete Nutrition Focused Physical Exam at this time.  Diet Order:  Diet NPO time specified Except for: Sips with Meds  Skin:  Reviewed, no issues  Last BM:  7/12  Height:   Ht Readings from Last 1 Encounters:  04/13/2015 6' (1.829 m)    Weight:   Wt Readings from Last 1 Encounters:  04/18/15 211 lb 6.7 oz (95.9 kg)    Ideal Body Weight:  81 kg  Wt Readings from Last 10 Encounters:  04/18/15 211 lb 6.7 oz (95.9 kg)  04/11/15 189 lb 14.4 oz (86.138 kg)  04/09/15 189 lb (85.73 kg)  04/05/15 189 lb (85.73 kg)  03/28/15 191 lb (86.637 kg)  03/26/15 191 lb (86.637 kg)  03/13/15 188 lb (85.276 kg)  11/02/13 178 lb (80.74 kg)    BMI:  Body mass index is 28.67 kg/(m^2).  Estimated Nutritional Needs:   Kcal:  1800-2000  Protein:  110-120 gm  Fluid:  1.8-2.0 L  EDUCATION NEEDS:   No  education needs identified at this time  Arthur Holms, RD, LDN Pager #: [phone] After-Hours Pager #: [phone]

## 2015-04-18 NOTE — Progress Notes (Signed)
Utilization Review Completed.Levi Shepard T7/14/2016  

## 2015-04-18 NOTE — Consult Note (Signed)
Referring Physician: Chrystine Oiler    Chief Complaint: Stroke  HPI:                                                                                                                                         TIEGAN TERPSTRA is an 74 y.o. male present to the hospital on 04/15/2015 for AO root dissection and underwent a AO root replacement on 04/16/13.  Upon waking this AM patient was noted to have a right gaze preference, inability to squeeze left hand, left facial droop and weakness of left leg. 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   Date last known well: Date: 04/18/2015 Time last known well: Unable to determine tPA Given: No: post op AO root replacement, unknown LKW Modified Rankin: Rankin Score=0   Past Medical History  Diagnosis Date  . Acute sinusitis, unspecified   . Ascending aortic aneurysm     4.8cm x 4.6cm in 2013  . Personal history of colonic polyps     x1-has surveillance colonoscopies.Complete Colonoscopy;with polypectomy [phone]; has surveillance colonoscopies.   . Aortic valve insufficiency   . Benign neoplasm of colon   . 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 Chest pain, unspecified   . Other dyspnea and respiratory abnormality   . Hypertension   . Hyperlipidemia   . Other testicular hypofunction   . Impotence of organic origin   . Nonspecific elevation of levels of transaminase or lactic acid dehydrogenase (LDH)   . Disorders of bursae and tendons in shoulder region, unspecified     Right  . Special screening for malignant neoplasm of prostate   . Complication of anesthesia   . PONV (postoperative nausea and vomiting)   . Heart murmur   . Unspecified asthma(493.90)     h/o, 2-3 yrs. since using albuterol   . History of hiatal hernia   . Arthritis     back & R  shoulder  . Cancer     facial- basal cell     Past Surgical History  Procedure Laterality Date  . Orif radial shaft fracture      Clo Treat of Fracture of Radial Shaft, Distal Dislocation; bilateral-age 42 years  . Cardiac catheterization N/A 04/05/2015    Procedure: Right/Left Heart Cath and Coronary Angiography;  Surgeon: Sherren Mocha, MD;  Location: [facility];  Service: Cardiovascular;  Laterality: N/A;  . Eye surgery Bilateral     LASIK    Family History  Problem Relation Age of Onset  . Anemia Neg Hx   . Arrhythmia Neg Hx   . Asthma Neg Hx   . Fainting Neg Hx   . Clotting disorder Neg Hx   . Heart attack Neg Hx   .  Heart disease Neg Hx   . Heart failure Neg Hx   . Hyperlipidemia Neg Hx   . Hypertension Neg Hx    Social History:  reports that he has never smoked. He does not have any smokeless tobacco history on file. He reports that he drinks alcohol. He reports that he does not use illicit drugs.  Allergies:  Allergies  Allergen Reactions  . Demerol Nausea And Vomiting    NAUSEA AND VOMITTING  . Morphine Hives and Rash    Medications:                                                                                                                           Prior to Admission:  Prescriptions prior to admission  Medication Sig Dispense Refill Last Dose  . aspirin EC 81 MG tablet Take 1 tablet (81 mg total) by mouth daily.     . Cholecalciferol (VITAMIN D PO) Take 400 Units by mouth daily.   Past Week at Unknown time  . cyclobenzaprine (FLEXERIL) 10 MG tablet Take 5-10 mg by mouth at bedtime as needed for muscle spasms.   Past Week at Unknown time  . ERTACZO 2 % CREA Apply 1 application topically daily as needed (rash).    04/16/2015 at Unknown time  . fenofibrate micronized (LOFIBRA) 67 MG capsule Take 67 mg by mouth daily.   Past Week at Unknown time  . lovastatin (MEVACOR) 20 MG tablet Take 20 mg by mouth daily.  1 04/16/2015 at Unknown time  . meloxicam  (MOBIC) 15 MG tablet Take 15 mg by mouth daily.  2 Past Week at Unknown time  . methocarbamol (ROBAXIN) 500 MG tablet Take 500 mg by mouth every 8 (eight) hours as needed.   2 Past Week at Unknown time  . Multiple Vitamin (MULTIVITAMIN) capsule Take 1 capsule by mouth daily.   Past Week at Unknown time  . Tamsulosin HCl (FLOMAX) 0.4 MG CAPS Take 0.4 mg by mouth at bedtime.   04/16/2015 at Unknown time  . Triamcinolone Acetonide (NASACORT ALLERGY 24HR NA) Place 1 spray into both nostrils daily as needed (allergies).   04/16/2015 at Unknown time   Scheduled: . acetaminophen  1,000 mg Oral 4 times per day   Or  . acetaminophen (TYLENOL) oral liquid 160 mg/5 mL  1,000 mg Per Tube 4 times per day  . antiseptic oral rinse  7 mL Mouth Rinse QID  . aspirin EC  325 mg Oral Daily   Or  . aspirin  324 mg Per Tube Daily  . bisacodyl  10 mg Oral Daily   Or  . bisacodyl  10 mg Rectal Daily  . cefUROXime (ZINACEF)  IV  1.5 g Intravenous Q12H  . chlorhexidine  15 mL Mouth Rinse BID  . docusate sodium  200 mg Oral Daily  . DOPamine  0-10 mcg/kg/min Intravenous To OR  . epinephrine  0-10 mcg/min Intravenous To OR  . fenofibrate  54 mg  Oral Daily  . insulin aspart  0-24 Units Subcutaneous 6 times per day  . insulin detemir  30 Units Subcutaneous Once  . [START ON 04/19/2015] insulin detemir  30 Units Subcutaneous Daily  . insulin regular  0-10 Units Intravenous TID WC  . metoprolol tartrate  12.5 mg Oral BID   Or  . metoprolol tartrate  12.5 mg Per Tube BID  . norepinephrine (LEVOPHED) Adult infusion  0-40 mcg/min Intravenous To OR  . [START ON 04/19/2015] pantoprazole  40 mg Oral Daily  . pravastatin  20 mg Oral q1800  . sodium chloride  3 mL Intravenous Q12H  . tamsulosin  0.4 mg Oral QHS    ROS:                                                                                                                                       History obtained from the patient  General ROS: negative for - chills,  fatigue, fever, night sweats, weight gain or weight loss Psychological ROS: negative for - behavioral disorder, hallucinations, memory difficulties, mood swings or suicidal ideation Ophthalmic ROS: negative for - blurry vision, double vision, eye pain or loss of vision ENT ROS: negative for - epistaxis, nasal discharge, oral lesions, sore throat, tinnitus or vertigo Allergy and Immunology ROS: negative for - hives or itchy/watery eyes Hematological and Lymphatic ROS: negative for - bleeding problems, bruising or swollen lymph nodes Endocrine ROS: negative for - galactorrhea, hair pattern changes, polydipsia/polyuria or temperature intolerance Respiratory ROS: negative for - cough, hemoptysis, shortness of breath or wheezing Cardiovascular ROS: negative for - chest pain, dyspnea on exertion, edema or irregular heartbeat Gastrointestinal ROS: negative for - abdominal pain, diarrhea, hematemesis, nausea/vomiting or stool incontinence Genito-Urinary ROS: negative for - dysuria, hematuria, incontinence or urinary frequency/urgency Musculoskeletal ROS: negative for - joint swelling or muscular weakness Neurological ROS: as noted in HPI Dermatological ROS: negative for rash and skin lesion changes  Neurologic Examination:                                                                                                      Blood pressure 119/64, pulse 88, temperature 97.8 F (36.6 C), temperature source Oral, resp. rate 15, height 6' (1.829 m), weight 95.9 kg (211 lb 6.7 oz), SpO2 96 %.  HEENT-  Normocephalic, no lesions, without obvious abnormality.  Normal external eye and conjunctiva.  Normal TM's bilaterally.  Normal auditory canals and external ears. Normal external nose, mucus membranes and septum.  Normal pharynx. Cardiovascular- S1, S2 normal, pulses palpable throughout   Lungs- chest clear, no wheezing, rales, normal symmetric air entry Abdomen- normal findings: bowel sounds  normal Extremities- no edema Lymph-no adenopathy palpable Musculoskeletal-no joint tenderness, deformity or swelling Skin-warm and dry, no hyperpigmentation, vitiligo, or suspicious lesions  Neurological Examination Mental Status: Alert, oriented, thought content appropriate.  Speech dysarthric without evidence of aphasia.  Able to follow 3 step commands without difficulty. Cranial Nerves: II: Discs flat bilaterally; Visual fields left hemianopsia, pupils equal, round, reactive to light and accommodation III,IV, VI: ptosis not present, extra-ocular motions shows forced right deviation and inability to look to the left or cross midline  V,VII: smile asymmetric on the left, facial light touch sensation decreased on the left VIII: hearing normal bilaterally IX,X: uvula rises symmetrically XI: bilateral shoulder shrug XII: midline tongue extension Motor: Right : Upper extremity   5/5    Left:     Upper extremity   0/5  Lower extremity   5/5     Lower extremity   3/5 Tone and bulk:normal tone throughout; no atrophy noted Sensory: Pinprick and light touch intact bilateral legs but less on the left, intact on the right arm but not left arm. Neglects to the left arm and leg to DSS Deep Tendon Reflexes: 2+ and symmetric throughout Plantars: Right: downgoing   Left: downgoing Cerebellar: normal finger-to-nose on the right unable to perform on the left and unable to assess heel-to-shin test due to chest pain Gait: not tested due to patient chest tube and multiple leads.        Lab Results: Basic Metabolic Panel:  Recent Labs Lab 04/11/15 1321  05/04/2015 1150 04/10/2015 1218 04/16/2015 1321 04/27/2015 1412 04/16/2015 1531 04/16/2015 2123 04/22/2015 2130 04/18/15 0450  NA 137  < > 135 132*  --  136 142  --  137 137  K 4.4  < > 4.9 3.6  --  4.6 4.5  --  3.7 3.9  CL 108  < > 102 99*  --  103  --   --  112* 107  CO2 19*  --   --   --   --   --   --   --   --  22  GLUCOSE 100*  < > 245* 227*  178* 153* 135*  --  133* 154*  BUN 22*  < > 26* 25*  --  24*  --   --  17 16  CREATININE 1.23  < > 1.10 1.00  --  1.00  --  1.34* 1.10 1.23  CALCIUM 10.2  --   --   --   --   --   --   --   --  8.9  MG  --   --   --   --   --   --   --  2.6*  --  2.1  < > = values in this interval not displayed.  Liver Function Tests:  Recent Labs Lab 04/11/15 1321  AST 31  ALT 27  ALKPHOS 35*  BILITOT 1.2  PROT 7.5  ALBUMIN 4.1   No results for input(s): LIPASE, AMYLASE in the last 168 hours. No results for input(s): AMMONIA in the last 168 hours.  CBC:  Recent Labs Lab 04/11/15 1321  04/23/2015 1215  04/21/2015 1530 05/02/2015 1531 04/21/2015 2123 04/05/2015 2130 04/18/15 0450  WBC 8.4  --   --   --  16.3*  --  12.7*  --  10.7*  HGB 17.3*  < > 10.2*  < > 13.7 13.3 12.7* 12.2* 11.9*  HCT 50.5  < > 29.9*  < > 38.6* 39.0 37.3* 36.0* 34.6*  MCV 93.2  --   --   --  91.7  --  91.6  --  91.8  PLT 232  --  129*  --  87*  --  66*  --  71*  < > = values in this interval not displayed.  Cardiac Enzymes: No results for input(s): CKTOTAL, CKMB, CKMBINDEX, TROPONINI in the last 168 hours.  Lipid Panel: No results for input(s): CHOL, TRIG, HDL, CHOLHDL, VLDL, LDLCALC in the last 168 hours.  CBG:  Recent Labs Lab 04/18/15 0438 04/18/15 0544 04/18/15 0726 04/18/15 0902 04/18/15 1010  GLUCAP 152* 130* 107* 127* 130*    Microbiology: Results for orders placed or performed during the hospital encounter of 04/11/15  Surgical pcr screen     Status: Abnormal   Collection Time: 04/11/15  1:25 PM  Result Value Ref Range Status   MRSA, PCR NEGATIVE NEGATIVE Final   Staphylococcus aureus POSITIVE (A) NEGATIVE Final    Comment:        The Xpert SA Assay (FDA approved for NASAL specimens in patients over 60 years of age), is one component of a comprehensive surveillance program.  Test performance has been validated by [facility] for patients greater than or equal to 57 year old. It is not  intended to diagnose infection nor to guide or monitor treatment.     Coagulation Studies:  Recent Labs  05/05/2015 1530  LABPROT 18.5*  INR 1.53*    Imaging: Ct Head Wo Contrast  04/18/2015   CLINICAL DATA:  Left hemiparesis.  Slurred speech and lethargy after surgery.  EXAM: CT HEAD WITHOUT CONTRAST  TECHNIQUE: Contiguous axial images were obtained from the base of the skull through the vertex without intravenous contrast.  COMPARISON:  Report of CT scan dated 03/20/2002  FINDINGS: There is an extensive nonhemorrhagic infarction in the right middle cerebral artery distribution. There is only 2 mm of midline shift from right to left. There is effacement of the cortical sulci in the area of infarction. The rest of the brain appears normal.  Slight chronic mucosal thickening of the left maxillary sinus. The osseous structures are otherwise normal. Small amount of air in the subcutaneous soft tissues at the left posterior lateral aspect of the neck.  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   Electronically Signed   By: Lorriane Shire M.D.   On: 04/18/2015 08:55   Dg Chest Port 1 View  04/18/2015   CLINICAL DATA:  Thoracic ascending aortic aneurysm  EXAM: PORTABLE CHEST - 1 VIEW  COMPARISON:  05/01/2015  FINDINGS: Endotracheal tube removed. Swan-Ganz catheter tip remains in the right pulmonary artery. NG tube removed. Mediastinal drains in place.  Increase in bibasilar atelectasis since the prior study. Possible bilateral pleural effusions. No pneumothorax. Cardiac enlargement.  IMPRESSION: Increase in bibasilar atelectasis and effusion following extubation. No pneumothorax.   Electronically Signed   By: Franchot Gallo M.D.   On: 04/18/2015 07:33   Dg Chest Port 1 View  04/20/2015   CLINICAL DATA:  Status post Bentall procedure  EXAM: PORTABLE CHEST - 1 VIEW  COMPARISON:  04/11/2015  FINDINGS: Cardiac shadow is enlarged. Postsurgical  changes are seen. A mediastinal drain, Swan-Ganz catheter, endotracheal tube and nasogastric catheter are seen in satisfactory position. The endotracheal  tube is 4 cm above the carina. No pneumothorax is seen. Left basilar atelectatic changes are noted.  IMPRESSION: Postsurgical change with mild left basilar atelectasis.  Tubes and lines as described.   Electronically Signed   By: Inez Catalina M.D.   On: 04/16/2015 15:55   Dg Abd Portable 1v  04/18/2015   CLINICAL DATA:  Feeding tube placement.  EXAM: PORTABLE ABDOMEN - 1 VIEW  COMPARISON:  None.  FINDINGS: A feeding tube is seen with tip overlying the body of the stomach. No evidence of dilated bowel loops. Opacity noted in left retrocardiac lung base.  IMPRESSION: Feeding tube tip overlies the body of the stomach.   Electronically Signed   By: Earle Gell M.D.   On: 04/18/2015 11:59    Etta Quill PA-C [facility] [phone]  04/18/2015, 12:08 PM   Patient seen and examined.  Clinical course and management discussed.  Necessary edits performed.  I agree with the above.  Assessment and plan of care developed and discussed below.     Assessment: 74 y.o. male s/p aortic valve replacement who today was noted to have left sided weakness.  Neurological examination c/w a right MCA infarct. Head CT personally reviewed and shows an area of hypodensity involving the right MCA territory.  48mm midline shift noted.  Patient not a tPA candidate due to recent surgery and unclear time of onset of symptoms.  Patient on ASA. A1c on 04/11/15 was 5.8.  TEE performed 7/13.  Dopplers performed 7/11 show bilateral mild mixed plaque origin ICA. 1-39% ICA stenosis.  Stroke Risk Factors - hyperlipidemia and hypertension   Recommendations: 1. Fasting lipid panel 2. PT consult, OT consult, Speech consult 3. Prophylactic therapy-Continue ASA 4. NPO until RN stroke swallow screen 5. Telemetry monitoring 6. Frequent neuro checks 7. Stroke team to evaluate  tomorrow   Alexis Goodell, MD [facility] [phone]  04/18/2015  1:13 PM

## 2015-04-18 NOTE — Op Note (Signed)
NAMERANELL, SKIBINSKI NO.:  [id number]  MEDICAL RECORD NO.:  [id number]  LOCATION:  2S07C                        FACILITY:  [facility]  PHYSICIAN:  [name]. Roxan Hockey, M.D.DATE OF BIRTH:  08/16/1941  DATE OF PROCEDURE:  04/26/2015 DATE OF DISCHARGE:                              OPERATIVE REPORT   PREOPERATIVE DIAGNOSIS:  Aortic root and ascending aortic aneurysm.  POSTOPERATIVE DIAGNOSIS:  Aortic root and ascending aortic aneurysm.  PROCEDURES:  Replacement of aortic root (biologic Bentall) and ascending aortic replacement using moderate hypothermic circulatory arrest with antegrade cerebral perfusion (Medtronic freestyle aortic root heart valve, 29 mm, serial number [id number], 30-mm Hemashield graft, serial number [id number]).  SURGEON:  [name]. Roxan Hockey, M.D.  ASSISTANT:  Suzzanne Cloud, P.A.  ANESTHESIA:  General.  FINDINGS:  Transesophageal echocardiography: large ascending and aortic root aneurysm, moderate-to-severe aortic regurgitation, moderate mitral regurgitation, left ventricular hypertrophy and dilatation.  Intraoperative: large ascending and root aneurysm, aorta relatively normal beyond the takeoff of the innominate artery, tricuspid aortic valve with large redundant leaflets, which would have been a difficult repair.  Postbypass transesophageal echocardiography: preserved left ventricular wall motion, good function of the prosthetic valve and mild mitral regurgitation.  CLINICAL NOTE:  Mr. Cardosa is a 74 year old gentleman, who has been followed for an aortic root and ascending aortic aneurysm since 2011. It was 4.8 x 4.6 cm at that time.  He recently presented for followup, and on CT there was an increase in the size of the aneurysm to 5.6 cm.  The options of continued observation versus surgical repair were discussed with the patient.  He very strongly preferred to proceed with surgical repair.  The patient was advised to have repair of  aortic root and ascending aortic aneurysm and possible aortic valve replacement depending on intraoperative findings.  The indications, risks, benefits, and alternatives were discussed in detail with the patient.  He understood and accepted the risks and agreed to proceed.  OPERATIVE NOTE:  Mr. Rindfleisch was brought to the preoperative holding room on April 17, 2015.  Anesthesia placed a Swan-Ganz catheter and an arterial blood pressure monitoring line.  He was taken to the operating room, anesthetized and intubated.  Intravenous antibiotics were administered.  A Foley catheter was placed.  Transesophageal echocardiography was performed and findings were as noted above.  The chest, abdomen and legs were prepped and draped in the usual sterile fashion. An incision was made in the right deltopectoral groove.  Hemostasis was achieved with electrocautery. The brachiocephalic vein was identified and preserved.  The fat pad between the pectoralis and deltoid was dissected.  The axillary artery was identified.  No cautery was used in the vicinity of the axillary artery or the brachial plexus.  Vessel loops were placed proximally and distally on the axillary artery and 5000 units of heparin was administered.  After the patient had been heparinized for 5 minutes, an arteriotomy was made and an arterial cannula with a Gore-Tex graft was sewn end-to-side to the axillary artery with a running 5-0 Prolene suture.  At the completion of the anastomosis, the distal clamp was removed first followed by the proximal clamp, the cannula was de-aired and connected  to the bypass circuit. Surgicel was placed around the anastomosis, the wound was packed.  A median sternotomy was performed.  Hemostasis was achieved.  A retractor was placed.  The mediastinal fat pad was dissected off the pericardium.  The pericardium was opened.  The aneurysm was clearly visible.  The heart was displaced inferiorly and laterally to  the left side.  The remainder of the full heparin dose was given.  After confirming adequate anticoagulation with ACT measurement, a dual-stage venous cannula was placed via a pursestring suture in the right atrial appendage.  Cardiopulmonary bypass was initiated and cooling was begun with a target of 22-degree Celsius.  A retrograde cardioplegia cannula was placed via pursestring suture in the right atrium and directed into the coronary sinus.  A left ventricular vent was placed via a pursestring suture in the right superior pulmonary vein and advanced into the left ventricle.  An antegrade cardioplegia cannula was placed in the ascending aorta. Carbon dioxide was insufflated into the operative field.  The aorta was crossclamped.  Cardiac arrest was achieved with a combination of cold antegrade and retrograde blood cardioplegia.  An initial 500 mL of cardioplegia was administered antegrade and there was a rapid diastolic arrest.  An additional 500 mL of cardioplegia was administered retrograde and there was septal cooling to less than 10 degrees Celsius.  Additional cardioplegia was administered at 20-minute intervals throughout the crossclamp.  The cardioplegia cannula was removed from the ascending aorta.  The aorta was transected.  The entire proximal ascending aorta and aortic root were markedly dilated.  The aortic valve leaflets were large and redundant and it was felt that this would be difficult to repair and achieve a good, lasting result.  The decision was made to proceed with a biologic Bentall root replacement. A 29-mm Medtronic freestyle aortic root porcine valve was rinsed per manufacturer's recommendations.  The aortic root was excised leaving buttons of aortic tissue around the coronary arteries.  By this point, the patient had cooled to 22 degree Celsius.  Steroids and propofol were administered to a BIS of 0.  The head was packed in ice.  The patient was placed in  steep Trendelenburg position.  Pump flow was stopped and the patient was exsanguinated.  The aortic crossclamp was removed.  The distal ascending aorta and aortic arch were inspected. As expected, the aorta was more normal in appearance in the arch and the innominate with the aneurysm ending just at the level of the innominate artery.  A clamp was placed on the innominate artery and antegrade cerebral perfusion was initiated at 500 mL/minute.  The 30-mm Hemashield graft was cut on a bias to match the size of the distal aorta and an end-to-end anastomosis was performed with a running 4-0 Prolene suture.  A Teflon felt buttress was used on the aortic side.  After completing the anastomosis, Coseal was applied to the suture line.  The clamp was removed from the innominate artery and the graft was crossclamped as it filled with blood, de-airing the graft.  After the graft was crossclamped, full flow was resumed.  The total circulatory arrest time was 33 minutes and the antegrade cerebral perfusion time was 30 minutes. Systemic rewarming was begun.  4-0 Ethibond sutures then were placed for the proximal suture line of the freestyle aortic root, 22 sutures were used.  These were then passed through the Dacron sewing cuff of the freestyle root, which was lowered into place and the sutures were sequentially tied.  The left coronary stump on the graft was removed and the left coronary button was trimmed to fit the opening.  The left main button then was anastomosed to the porcine root with a running 6-0 Prolene suture.  After this was done, Coseal was applied to this anastomosis as well as the proximal suture line.  The porcine aorta then was anastomosed end-to-end to the graft with a running 4-0 Prolene suture.  The graft was briefly distended with blood. The right coronary stump was excised from the porcine graft.  The right coronary button was trimmed to match and then the right coronary  proximal anastomosis was performed with a running 6-0 Prolene suture.  Coseal was applied around this anastomosis as well.  A McGoon needle was placed into the ascending graft, de-airing was performed.  A warm dose of retrograde cardioplegia was administered and the aortic crossclamp was removed.  Total crossclamp time was 140 minutes.  The patient initially was in heart block with a slow ventricular escape around 40 beats per minute.  There was some bleeding from the distal anastomosis that was repaired with 4-0 Prolene pledgeted sutures.  There was good hemostasis at the other anastomoses.  While rewarming was completed, epicardial pacing wires were placed on the right ventricle and right atrium and DDD pacing was initiated.  A dopamine infusion was initiated at 3 mcg/kg/min.  The retrograde cardioplegia cannula was removed.  Additional de-airing was performed as the left ventricular vent was removed.  When the patient had warmed to 37 degrees Celsius, a trial wean was performed from cardiopulmonary bypass. Initially there were ST elevations.  The patient did wean from bypass; however, hemodynamics were unfavorable.  The dopamine infusion was increased to 5 mcg/kg/min and an epinephrine infusion was initiated at 2 mcg/minute.  The patient was placed back on cardiopulmonary bypass to rest the heart. After 15 minutes the patient was weaned from cardiopulmonary bypass on dopamine and epinephrine infusions and DDD paced.  He weaned without difficulty on the second attempt and had good hemodynamics after coming off bypass.  The total bypass time was 188 minutes.  Postbypass transesophageal echocardiography showed no aortic insufficiency.  The mitral regurgitation had decreased from moderate to mild.  Left ventricular function was preserved.  The LV was still relatively dilated and hypertrophied.  A test dose of protamine was administered and was well tolerated.  The atrial cannula was  removed. A 35 mm Endo-GIA stapler with a 3.5-mm staple cartridge was placed across the graft portion of the arterial cannula near the anastomosis and fired, dividing it. The remainder of the protamine was administered without incident.   There was good hemostasis of the graft.  This wound was packed once again.  The remainder of the protamine was administered without incident.  The chest was copiously irrigated with warm saline. Hemostasis was achieved.  There was some coagulopathic bleeding. Surgicel was applied at all the anastomotic sites.  Fresh frozen plasma was ordered and given once available.  The pericardium was reapproximated over the aorta and base of the heart with interrupted 3-0 silk sutures that came together easily without tension.  A 36-French Blake drain was placed in the pericardium along the diaphragmatic surface and a 36-French chest tube was placed in the anterior mediastinum, both were secured with #1 silk sutures.  The sternum was closed with a combination of single and double heavy gauge stainless steel wires.  The pectoralis fascia, subcutaneous tissue, and skin were closed in standard fashion.  The  patient remained hemodynamically stable throughout the postbypass period and transported from the operating room to the surgical intensive care unit in good condition.     [name] Roxan Hockey, M.D.     SCH/MEDQ  D:  04/14/2015  T:  04/18/2015  Job:  [id number]

## 2015-04-18 NOTE — Progress Notes (Signed)
PT Cancellation Note  Patient Details Name: Levi Shepard MRN: [id number] DOB: 01-14-1941   Cancelled Treatment:    Reason Eval/Treat Not Completed: Patient not medically ready. Noted new onset CVA post-op with 73mm shift (non-hemorrhagic). BPs low (100/60s). Routinely allow 24 hrs bedrest post acute CVA to maximize perfusion. Also await clarified BP parameters and activity orders. Will attempt 7/15 if medically appropriate.   Neila Teem 04/18/2015, 4:09 PM Pager [phone]

## 2015-04-18 NOTE — Progress Notes (Signed)
      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 Doy Mince assistance  Levi Lipps C. Roxan Hockey, MD [facility] [phone]

## 2015-04-18 NOTE — Progress Notes (Signed)
Dr. Beverly Milch with neuorology notified of completion of head CT.

## 2015-04-18 NOTE — Procedures (Signed)
Extubation Procedure Note  Patient Details:   Name: Levi Shepard DOB: 08/15/1941 MRN: [id number]   Airway Documentation:     Evaluation  O2 sats: stable throughout Complications: No apparent complications Patient did tolerate procedure well. Bilateral Breath Sounds: Clear Suctioning: Airway Yes  Patient peformed NIF(-20) and FVC(1.1 Liters). Extubated to 4L [name]. Patient performed 335ml on IS.   Patsy Baltimore Beth Spackman 04/18/2015, 4:01 AM

## 2015-04-18 NOTE — Progress Notes (Signed)
1 Day Post-Op Procedure(s) (LRB): BENTALL PROCEDURE (N/A) TRANSESOPHAGEAL ECHOCARDIOGRAM (TEE) (N/A) AORTIC VALVE REPLACEMENT (AVR) (N/A) Subjective: Denies pain Speech slurred  Objective: Vital signs in last 24 hours: Temp:  [96.3 F (35.7 C)-98.8 F (37.1 C)] 97.7 F (36.5 C) (07/14 0700) Pulse Rate:  [40-92] 89 (07/14 0700) Cardiac Rhythm:  [-] Atrial paced (07/13 2245) Resp:  [11-27] 15 (07/14 0700) BP: (79-133)/(55-81) 96/74 mmHg (07/14 0700) SpO2:  [94 %-100 %] 97 % (07/14 0700) Arterial Line BP: (90-150)/(51-93) 108/62 mmHg (07/14 0700) FiO2 (%):  [40 %-60 %] 40 % (07/14 0248) Weight:  [211 lb 6.7 oz (95.9 kg)] 211 lb 6.7 oz (95.9 kg) (07/14 0615)  Hemodynamic parameters for last 24 hours: PAP: (20-46)/(10-27) 24/14 mmHg CO:  [3.1 L/min-6.2 L/min] 5.5 L/min CI:  [1.5 L/min/m2-3 L/min/m2] 2.7 L/min/m2  Intake/Output from previous day: 07/13 0701 - 07/14 0700 In: 8807.3 [I.V.:4637.3; Blood:1820; IV ZOXWRUEAV:4098] Out: 1191 [Urine:4885; Blood:2000; Chest Tube:250] Intake/Output this shift:    General appearance: cooperative and no distress Neurologic: answers queestions appropriately, right side intact, left UE 0/5, left LE moves spontaneously but weak Heart: regular rate and rhythm Lungs: clear to auscultation bilaterally Abdomen: normal findings: soft, non-tender  Lab Results:  Recent Labs  04/09/2015 2123 04/18/2015 2130 04/18/15 0450  WBC 12.7*  --  10.7*  HGB 12.7* 12.2* 11.9*  HCT 37.3* 36.0* 34.6*  PLT 66*  --  71*   BMET:  Recent Labs  04/26/2015 2130 04/18/15 0450  NA 137 137  K 3.7 3.9  CL 112* 107  CO2  --  22  GLUCOSE 133* 154*  BUN 17 16  CREATININE 1.10 1.23  CALCIUM  --  8.9    PT/INR:  Recent Labs  04/05/2015 1530  LABPROT 18.5*  INR 1.53*   ABG    Component Value Date/Time   PHART 7.447 04/18/2015 0335   HCO3 22.6 04/18/2015 0335   TCO2 24 04/18/2015 0335   ACIDBASEDEF 1.0 04/18/2015 0335   O2SAT 95.0 04/18/2015 0335    CBG (last 3)   Recent Labs  04/29/2015 2119 04/09/2015 2233 04/30/2015 2335  GLUCAP 127* 139* 139*    Assessment/Plan: S/P Procedure(s) (LRB): BENTALL PROCEDURE (N/A) TRANSESOPHAGEAL ECHOCARDIOGRAM (TEE) (N/A) AORTIC VALVE REPLACEMENT (AVR) (N/A) POD # 1  NEURO- left hemiparesis c/w CVA- Neuro consulted  Head CT  PT/OT/Speech consults  Not a candidate for thrombolytics  CV- bradycardic in 30s, atrial paced  Good hemodynamics- dc swan  RESP- sats OK on nasal cannula O2  Difficulty managing secretions  RENAL- lytes and creatinine OK  ENDO- CBG mildly elevated, transition to levemir + SSI  Thrombocytopenia- stable, no active bleeding, avoid enoxaparin, use SCD for DVT prophylaxis     LOS: 1 day    Melrose Nakayama 04/18/2015

## 2015-04-18 NOTE — Progress Notes (Signed)
RT began rapid wean at 0245.

## 2015-04-18 NOTE — Evaluation (Signed)
Clinical/Bedside Swallow Evaluation Patient Details  Name: Levi Shepard MRN: [id number] Date of Birth: April 17, 1941  Today's Date: 04/18/2015 Time: SLP Start Time (ACUTE ONLY): 0933 SLP Stop Time (ACUTE ONLY): 1008 SLP Time Calculation (min) (ACUTE ONLY): 35 min  Past Medical History:  Past Medical History  Diagnosis Date  . Acute sinusitis, unspecified   . Ascending aortic aneurysm     4.8cm x 4.6cm in 2013  . Personal history of colonic polyps     x1-has surveillance colonoscopies.Complete Colonoscopy;with polypectomy [phone]; has surveillance colonoscopies.   . Aortic valve insufficiency   . Benign neoplasm of colon   . 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 Chest pain, unspecified   . Other dyspnea and respiratory abnormality   . Hypertension   . Hyperlipidemia   . Other testicular hypofunction   . Impotence of organic origin   . Nonspecific elevation of levels of transaminase or lactic acid dehydrogenase (LDH)   . Disorders of bursae and tendons in shoulder region, unspecified     Right  . Special screening for malignant neoplasm of prostate   . Complication of anesthesia   . PONV (postoperative nausea and vomiting)   . Heart murmur   . Unspecified asthma(493.90)     h/o, 2-3 yrs. since using albuterol   . History of hiatal hernia   . Arthritis     back & R shoulder  . Cancer     facial- basal cell    Past Surgical History:  Past Surgical History  Procedure Laterality Date  . Orif radial shaft fracture      Clo Treat of Fracture of Radial Shaft, Distal Dislocation; bilateral-age 14 years  . Cardiac catheterization N/A 04/05/2015    Procedure: Right/Left Heart Cath and Coronary Angiography;  Surgeon: Sherren Mocha, MD;  Location: [facility];  Service: Cardiovascular;  Laterality: N/A;  . Eye surgery Bilateral     LASIK   HPI:  74 y.o. male admitted for replacement aortic root and ascending  aorta 04/18/2015.  Post-op developed s/s of stroke.  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  Pt with difficulty managing secretions; poor toleration of ice chips with wet, congested cough elicited after all trials.  He is a high aspiration risk.  Recommend NPO for now; discussed with Dr. Roxan Hockey, who plans to place NG for nutrition.  Discussed dysphagia, aspiration risk, prognosis and plan of care  with pt and his daughter, Levi Shepard.  SLP will follow for PO readiness.        Aspiration Risk  Moderate    Diet Recommendation NPO;Alternative means - temporary   Medication Administration: Via alternative means    Other  Recommendations Oral Care Recommendations: Oral care QID   Follow Up Recommendations       Frequency and Duration min 3x week  2 weeks     Swallow Study Prior Functional Status       General Date of Onset: 04/18/15 Other Pertinent Information: 74 y.o. male admitted for replacement aortic root and ascending aorta 04/08/2015.  Post-op developed s/s of stroke.  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 Type of Study: Bedside swallow evaluation Previous Swallow Assessment: no Diet Prior to this  Study: NPO Temperature Spikes Noted: No Respiratory Status: Supplemental O2 delivered via (comment) History of Recent Intubation: Yes Length of Intubations (days): 1 days Date extubated: 04/18/15 Behavior/Cognition: Alert Oral Cavity - Dentition: Adequate natural dentition/normal for age Patient Positioning: Upright in bed Baseline Vocal Quality: Normal    Oral/Motor/Sensory Function Overall Oral Motor/Sensory Function: Impaired (left asymmetry CN V, VII, X)   Ice Chips Ice chips: Impaired Presentation:  Spoon Oral Phase Impairments: Reduced labial seal Oral Phase Functional Implications: Left lateral sulci pocketing;Prolonged oral transit Pharyngeal Phase Impairments: Suspected delayed Swallow;Wet Vocal Quality;Cough - Delayed   Thin Liquid Thin Liquid: Not tested    Nectar Thick Nectar Thick Liquid: Not tested   Honey Thick Honey Thick Liquid: Not tested   Puree Puree: Not tested   Solid  Miles Leyda L. [place], [place] CCC/SLP Pager [phone]     Solid: Not tested       [name] 04/18/2015,10:18 AM

## 2015-04-19 ENCOUNTER — Inpatient Hospital Stay (HOSPITAL_COMMUNITY): Payer: Medicare Other

## 2015-04-19 DIAGNOSIS — I6340 Cerebral infarction due to embolism of unspecified cerebral artery: Secondary | ICD-10-CM | POA: Insufficient documentation

## 2015-04-19 LAB — CBC
HCT: 32.6 % — ABNORMAL LOW (ref 39.0–52.0)
Hemoglobin: 11.1 g/dL — ABNORMAL LOW (ref 13.0–17.0)
MCH: 31.4 pg (ref 26.0–34.0)
MCHC: 34 g/dL (ref 30.0–36.0)
MCV: 92.1 fL (ref 78.0–100.0)
Platelets: 74 10*3/uL — ABNORMAL LOW (ref 150–400)
RBC: 3.54 MIL/uL — AB (ref 4.22–5.81)
RDW: 13.2 % (ref 11.5–15.5)
WBC: 11.9 10*3/uL — AB (ref 4.0–10.5)

## 2015-04-19 LAB — BASIC METABOLIC PANEL
Anion gap: 7 (ref 5–15)
BUN: 20 mg/dL (ref 6–20)
CO2: 24 mmol/L (ref 22–32)
Calcium: 9.2 mg/dL (ref 8.9–10.3)
Chloride: 108 mmol/L (ref 101–111)
Creatinine, Ser: 0.92 mg/dL (ref 0.61–1.24)
GLUCOSE: 122 mg/dL — AB (ref 65–99)
POTASSIUM: 3.5 mmol/L (ref 3.5–5.1)
Sodium: 139 mmol/L (ref 135–145)

## 2015-04-19 LAB — GLUCOSE, CAPILLARY
GLUCOSE-CAPILLARY: 106 mg/dL — AB (ref 65–99)
GLUCOSE-CAPILLARY: 114 mg/dL — AB (ref 65–99)
GLUCOSE-CAPILLARY: 128 mg/dL — AB (ref 65–99)
Glucose-Capillary: 114 mg/dL — ABNORMAL HIGH (ref 65–99)
Glucose-Capillary: 128 mg/dL — ABNORMAL HIGH (ref 65–99)
Glucose-Capillary: 107 mg/dL — ABNORMAL HIGH (ref 65–99)
Glucose-Capillary: 129 mg/dL — ABNORMAL HIGH (ref 65–99)

## 2015-04-19 MED ORDER — INSULIN DETEMIR 100 UNIT/ML ~~LOC~~ SOLN
20.0000 [IU] | Freq: Every day | SUBCUTANEOUS | Status: DC
  Administered 2015-04-19 – 2015-04-24 (×6): 20 [IU] via SUBCUTANEOUS
  Filled 2015-04-19 (×6): qty 0.2

## 2015-04-19 MED ORDER — VITAL AF 1.2 CAL PO LIQD
1000.0000 mL | ORAL | Status: DC
  Administered 2015-04-19 – 2015-04-24 (×7): 1000 mL
  Filled 2015-04-19 (×11): qty 1000

## 2015-04-19 MED ORDER — FUROSEMIDE 10 MG/ML IJ SOLN
20.0000 mg | Freq: Once | INTRAMUSCULAR | Status: AC
  Administered 2015-04-19: 20 mg via INTRAVENOUS
  Filled 2015-04-19: qty 2

## 2015-04-19 MED ORDER — POTASSIUM CHLORIDE 10 MEQ/50ML IV SOLN
10.0000 meq | INTRAVENOUS | Status: AC
  Administered 2015-04-19 (×3): 10 meq via INTRAVENOUS
  Filled 2015-04-19 (×3): qty 50

## 2015-04-19 NOTE — Progress Notes (Signed)
STROKE TEAM PROGRESS NOTE   HISTORY Levi Shepard is a 74 y.o. 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 Upon waking this AM patient was noted to have a right gaze preference, inability to squeeze left hand, left facial droop and weakness of left leg. 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   Date last known well: Date: 04/18/2015 Time last known well: Unable to determine tPA Given: No: post op AO root replacement, unknown LKW Modified Rankin: Rankin Score=0   SUBJECTIVE (INTERVAL HISTORY) Patient's daughter is at the bedside. The patient is poorly responsive however he is able to follow some simple commands.    OBJECTIVE Temp:  [97.8 F (36.6 C)-100.2 F (37.9 C)] 98.6 F (37 C) (07/15 0400) Pulse Rate:  [69-90] 69 (07/15 0700) Cardiac Rhythm:  [-] Atrial paced (07/15 0700) Resp:  [8-27] 25 (07/15 0700) BP: (70-148)/(54-107) 141/85 mmHg (07/15 0700) SpO2:  [90 %-99 %] 95 % (07/15 0700) Arterial Line BP: (109-168)/(54-90) 157/77 mmHg (07/15 0600) Weight:  [88.5 kg (195 lb 1.7 oz)] 88.5 kg (195 lb 1.7 oz) (07/15 0500)   Recent Labs Lab 04/18/15 1308 04/18/15 1412 04/18/15 1929 04/18/15 2339 04/19/15 0353  GLUCAP 122* 124* 128* 114* 106*    Recent Labs Lab 05/05/2015 1412 04/27/2015 1531 05/03/2015 2123 04/05/2015 2130 04/18/15 0450 04/18/15 1615 04/18/15 1617 04/19/15 0350  NA 136 142  --  137 137  --  141 139  K 4.6 4.5  --  3.7 3.9  --  3.8 3.5  CL 103  --   --  112* 107  --  104 108  CO2  --   --   --   --  22  --   --  24  GLUCOSE 153* 135*  --  133* 154*  --  139* 122*  BUN 24*  --   --  17 16  --  20 20  CREATININE 1.00  --  1.34* 1.10 1.23 1.03 0.90 0.92  CALCIUM  --   --   --   --  8.9  --   --  9.2  MG  --   --  2.6*  --  2.1 1.9  --   --    No  results for input(s): AST, ALT, ALKPHOS, BILITOT, PROT, ALBUMIN in the last 168 hours.  Recent Labs Lab 04/23/2015 1530  04/12/2015 2123 04/23/2015 2130 04/18/15 0450 04/18/15 1615 04/18/15 1617 04/19/15 0350  WBC 16.3*  --  12.7*  --  10.7* 10.1  --  11.9*  HGB 13.7  < > 12.7* 12.2* 11.9* 11.1* 10.9* 11.1*  HCT 38.6*  < > 37.3* 36.0* 34.6* 32.7* 32.0* 32.6*  MCV 91.7  --  91.6  --  91.8 92.1  --  92.1  PLT 87*  --  66*  --  71* 72*  --  74*  < > = values in this interval not displayed. No results for input(s): CKTOTAL, CKMB, CKMBINDEX, TROPONINI in the last 168 hours.  Recent Labs  04/18/2015 1530  LABPROT 18.5*  INR 1.53*   No results for input(s): COLORURINE, LABSPEC, PHURINE, GLUCOSEU, HGBUR, BILIRUBINUR, KETONESUR, PROTEINUR, UROBILINOGEN, NITRITE, LEUKOCYTESUR in the last 72 hours.  Invalid input(s): APPERANCEUR  No results found for: CHOL, TRIG, HDL, CHOLHDL, VLDL, LDLCALC Lab Results  Component Value Date  HGBA1C 5.8* 04/11/2015   No results found for: LABOPIA, COCAINSCRNUR, LABBENZ, AMPHETMU, THCU, LABBARB  No results for input(s): ETH in the last 168 hours.   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      Dg Chest Port 1 View 04/19/2015    Decreased right basilar atelectasis.  Persistent left retrocardiac atelectasis versus consolidation. No pneumothorax visualized.     Dg Chest Port 1 View 04/18/2015    Increase in bibasilar atelectasis and effusion following extubation. No pneumothorax.      Dg Chest Port 1 View 05/01/2015    Postsurgical change with mild left basilar atelectasis.  Tubes and lines as described.       Dg Abd Portable 1v 04/18/2015    Feeding tube tip overlies the body of the stomach.       PHYSICAL EXAM  Frail elderly caucasian male not in distress. . Afebrile. Head is nontraumatic. Neck is supple without bruit.    Cardiac exam no murmur or  gallop. Lungs are clear to auscultation. Distal pulses are well felt. Has incision from recent surgery and bandages on the chest and tubes post op. Neurological Exam :  Slightly drowsy but can easily aroused and sustained attention and follow commands. Right gaze preference but able to look to the left past midline but not all the way. Blinks to threat on the right but not on the left. Fundi were not visualized. Vision acuity seems adequate. Left lower facial weakness. Tongue midline. Left hemiplegia with 0/5 left upper extremity strength and hypotonia. Left lower extremity strength is 2/5. Diminished left hemibody sensation and mild left hemi-neglect. Unable to recognize his own left hand. Normal and purposeful movements on the right side.   ASSESSMENT/PLAN [name] is a 74 y.o. 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  He did not receive IV t-PA due to recent surgery and unknown time of onset.  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   Carotid Doppler Bilateral: mild mixed plaque origin ICA. 1-39% ICA stenosis. Vertebral artery flow is antegrade.   2D Echo - EF 50-55%. No cardiac source of emboli identified.  TEE - 05/05/2015 - report pending  LDL not performed - check in a.m.  HgbA1c 5.8  SCDs for VTE prophylaxis  Diet NPO time specified Except for: Sips with Meds  aspirin 81 mg orally every day prior to admission, now on aspirin 300 mg suppository daily  Ongoing aggressive stroke risk factor management  Therapy recommendations: Pending  Disposition:  Pending  Hypertension  Home meds:  No antihypertensives  medications prior to admission  Stable  Now on metoprolol   Hyperlipidemia  Home meds:  Fenofibrate and lovastatin - now on Pravachol and fenofibrate  LDL pending, goal < 70  Continue statin at discharge    Other Stroke Risk Factors  Advanced age  ETOH use  Recent aortic root dissection and subsequent replacement  Other Active Problems  Mild anemia  Other Pertinent History    Hospital day # 2  Mikey Bussing PA-C [facility] Pager [phone] 04/19/2015, 1:00 PM  He unfortunately  has a right MCA large infarct during the perioperative period and has significant deficits and remains at risk for development cerebral edema, neurological worsening, recurrent stroke, TIAs and needs ongoing stroke evaluation and risk factor control. I had a long discussion with the patient's daughter at the bedside about his neurological condition, prognosis, plan for evaluation, treatment and answered questions. We will continue to monitor him closely over the next few days and plan to repeat CT angiogram of the brain tomorrow morning. If he develops increasing edema he may need reintubation and hypertonic saline This patient is critically ill and at significant risk of neurological worsening, death and care requires constant monitoring of vital signs, hemodynamics,respiratory and cardiac monitoring, extensive review of multiple databases, frequent neurological assessment, discussion with family, other specialists and medical decision making of high complexity.I have made any additions or clarifications directly to the above note.This critical care time does not reflect procedure time, or teaching time or supervisory time of PA/NP/Med Resident etc but could involve care discussion time.  I spent 30 minutes of neurocritical care time  in the care of  this patient.  Antony Contras, MD    To contact Stroke Continuity provider, please refer to [email or web address] After hours, contact General  Neurology

## 2015-04-19 NOTE — Progress Notes (Signed)
Nutrition Follow-up   INTERVENTION:   Continue Vital AF 1.2 continuously @ 65 ml/hr via NGT; provides 1872 kcal, 117 grams of protein, and 1264 ml of water daily  NUTRITION DIAGNOSIS:   Inadequate oral intake related to inability to eat (s/p CVA) as evidenced by NPO status.  Ongoing  GOAL:   Patient will meet greater than or equal to 90% of their needs  Unmet  MONITOR:   TF tolerance, Diet advancement, Labs, Weight trends, Skin  REASON FOR ASSESSMENT:   Other (Comment) (Tube Feeds)    ASSESSMENT:   74 y.o. Male admitted for replacement aortic root and ascending aorta 04/20/2015. Post-op developed s/s of stroke. 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  Pt remains NPO; re-assessed by SLP today and continues to be severe aspiration risk. NGT in place, Vital AF 1.2 was started at 65 ml/hr about 1300 hr today. Pt resting comfortably at time of visit. Appears well-nourished.   Labs reviewed.  Diet Order:  Diet NPO time specified Except for: Sips with Meds  Skin:  Wound (see comment) (closed incision on chest)  Last BM:  7/12  Height:   Ht Readings from Last 1 Encounters:  04/28/2015 6' (1.829 m)    Weight:   Wt Readings from Last 1 Encounters:  04/19/15 195 lb 1.7 oz (88.5 kg)    Ideal Body Weight:  81 kg  Wt Readings from Last 10 Encounters:  04/19/15 195 lb 1.7 oz (88.5 kg)  04/11/15 189 lb 14.4 oz (86.138 kg)  04/09/15 189 lb (85.73 kg)  04/05/15 189 lb (85.73 kg)  03/28/15 191 lb (86.637 kg)  03/26/15 191 lb (86.637 kg)  03/13/15 188 lb (85.276 kg)  11/02/13 178 lb (80.74 kg)    BMI:  Body mass index is 26.46 kg/(m^2).  Estimated Nutritional Needs:   Kcal:  1800-2000  Protein:  110-120 gm  Fluid:  1.8-2.0 L  EDUCATION NEEDS:   No education needs identified at this time  Pryor Ochoa RD, LDN Inpatient Clinical Dietitian Pager: [phone] After Hours Pager: [phone]

## 2015-04-19 NOTE — Evaluation (Signed)
Speech Language Pathology Evaluation Patient Details Name: Levi Shepard MRN: [id number] DOB: 1941/07/28 Today's Date: 04/19/2015 Time: 1020-1040 SLP Time Calculation (min) (ACUTE ONLY): 20 min  Problem List:  Patient Active Problem List   Diagnosis Date Noted  . Cerebral embolism with cerebral infarction 04/19/2015  . Thoracic ascending aortic aneurysm 04/21/2015  . Aortic valve disorder   . Hypertension   . Aortic valve insufficiency   . LBP (low back pain) 12/19/2014  . Spondylolisthesis at L5-S1 level 12/19/2014  . 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 Anterior epistaxis 11/02/2013  . Ascending aortic aneurysm 10/28/2011  . Hyperlipidemia 10/28/2011  . Colon polyps 10/28/2011  . BPH (benign prostatic hypertrophy) 10/28/2011  . Asthma 10/28/2011  . Biceps tendon tear 10/28/2011   Past Medical History:  Past Medical History  Diagnosis Date  . Acute sinusitis, unspecified   . Ascending aortic aneurysm     4.8cm x 4.6cm in 2013  . Personal history of colonic polyps     x1-has surveillance colonoscopies.Complete Colonoscopy;with polypectomy [phone]; has surveillance colonoscopies.   . Aortic valve insufficiency   . Benign neoplasm of colon   . 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 Chest pain, unspecified   . Other dyspnea and respiratory abnormality   . Hypertension   . Hyperlipidemia   . Other testicular hypofunction   . Impotence of organic origin   . Nonspecific elevation of levels of transaminase or lactic acid dehydrogenase (LDH)   . Disorders of bursae and tendons in shoulder region, unspecified     Right  . Special screening for malignant neoplasm of prostate   . Complication of anesthesia   . PONV (postoperative nausea and vomiting)   . Heart murmur   . Unspecified asthma(493.90)     h/o, 2-3 yrs. since using albuterol   . History of hiatal hernia   . Arthritis     back & R shoulder   . Cancer     facial- basal cell    Past Surgical History:  Past Surgical History  Procedure Laterality Date  . Orif radial shaft fracture      Clo Treat of Fracture of Radial Shaft, Distal Dislocation; bilateral-age 40 years  . Cardiac catheterization N/A 04/05/2015    Procedure: Right/Left Heart Cath and Coronary Angiography;  Surgeon: Sherren Mocha, MD;  Location: [facility];  Service: Cardiovascular;  Laterality: N/A;  . Eye surgery Bilateral     LASIK  . [place] procedure N/A 05/05/2015    Procedure: BENTALL PROCEDURE;  Surgeon: Melrose Nakayama, MD;  Location: [facility];  Service: Open Heart Surgery;  Laterality: N/A;  . Tee without cardioversion N/A 05/04/2015    Procedure: TRANSESOPHAGEAL ECHOCARDIOGRAM (TEE);  Surgeon: Melrose Nakayama, MD;  Location: [facility];  Service: Open Heart Surgery;  Laterality: N/A;  . Aortic valve replacement N/A 04/28/2015    Procedure: AORTIC VALVE REPLACEMENT (AVR);  Surgeon: Melrose Nakayama, MD;  Location: [facility];  Service: Open Heart Surgery;  Laterality: N/A;   HPI:  74 y.o. male admitted for replacement aortic root and ascending aorta 04/25/2015.  Post-op developed s/s of stroke.  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   Assessment / Plan / Recommendation Clinical Impression  Pt presents with cognitive-linguistic deficits and dysarthria s/p large right CVA.  There is inattention to left, disorientation to elements of time, but relatively preserved short-term recall.  Pt  with preserved sustained attention to verbal tasks, impaired selective attention and insight.  Recommend SLP tx to address the aforementioned deficits.  Pt independent prior to admission - runs his own business, physically active, and has good family support from daughters  (wife is in SNF with advanced Alzheimer's).  Recommend CIR consult.      SLP Assessment  Patient needs continued Speech Lanaguage Pathology  Services    Follow Up Recommendations  Inpatient Rehab    Frequency and Duration min 3x week  2 weeks   Pertinent Vitals/Pain Pain Assessment: Faces Faces Pain Scale: Hurts little more   SLP Goals  Potential to Achieve Goals (ACUTE ONLY): Good  SLP Evaluation Prior Functioning  Cognitive/Linguistic Baseline: Within functional limits Type of Home: House  Lives With: Alone Available Help at Discharge: Family Vocation: Full time employment   Cognition  Overall Cognitive Status: Impaired/Different from baseline Arousal/Alertness: Awake/alert Orientation Level: Oriented to person;Oriented to place;Oriented to situation;Disoriented to time Attention: Sustained Sustained Attention: Appears intact Memory: Appears intact (for short-term recall limited targets) Awareness: Impaired Awareness Impairment: Emergent impairment Behaviors:  (flat affect; monotone)    Comprehension  Auditory Comprehension Overall Auditory Comprehension: Appears within functional limits for tasks assessed Reading Comprehension Reading Status: Not tested    Expression Expression Primary Mode of Expression: Verbal Verbal Expression Overall Verbal Expression: Appears within functional limits for tasks assessed Written Expression Dominant Hand: Left Written Expression: Not tested   Oral / Motor Oral Motor/Sensory Function Overall Oral Motor/Sensory Function: Impaired Motor Speech Overall Motor Speech: Impaired (mild dysarthria)   Stefana Lodico L. Tivis Ringer, [place] CCC/SLP Pager [phone]      [name] 04/19/2015, 11:08 AM

## 2015-04-19 NOTE — Progress Notes (Signed)
OT Cancellation Note  Patient Details Name: Levi Shepard MRN: [id number] DOB: [birth year]   Cancelled Treatment:    Reason Eval/Treat Not Completed: Patient not medically ready - Pt currently on bedrest.  Will reattempt.   Darlina Rumpf [place], OTR/L [phone]   04/19/2015, 11:41 AM

## 2015-04-19 NOTE — Progress Notes (Signed)
PT Cancellation Note  Patient Details Name: Levi Shepard MRN: [id number] DOB: May 02, 1941   Cancelled Treatment:    Reason Eval/Treat Not Completed: Patient not medically ready. Per neuro MD pt not to get OOB until tomorrow. Will hold PT at this time and check back for medical appropriateness to participate in eval tomorrow.    Rolinda Roan 04/19/2015, 11:04 AM   Rolinda Roan, PT, DPT Acute Rehabilitation Services Pager: [phone]

## 2015-04-19 NOTE — Care Management Important Message (Signed)
Important Message  Patient Details  Name: Levi Shepard MRN: [id number] Date of Birth: [birth year]   Medicare Important Message Given:  Yes-second notification given    Pricilla Handler 04/19/2015, 11:29 AM

## 2015-04-19 NOTE — Progress Notes (Signed)
Speech Language Pathology Treatment: Dysphagia  Patient Details Name: Levi Shepard MRN: [id number] DOB: March 08, 1941 Today's Date: 04/19/2015 Time: 1040-1050 SLP Time Calculation (min) (ACUTE ONLY): 10 min  Assessment / Plan / Recommendation Clinical Impression  Pt now NPO with Panda for nutrition.  Continues to have difficulty managing secretions.  Provided with limited ice chips per pt's request, which elicited delayed swallow, weak/wet and effortful cough, requiring mod cues to expel material.  Pt remains a high aspiration risk.  D/W daughter our plan to continue to assess for readiness for POs.  Pt will not likely be seen again by SLP services until Monday.  However, if he shows improved alertness/readiness over weekend, please page weekend SLP at [phone].  Dtr/Rn verbalize agreement.    HPI Other Pertinent Information: 74 y.o. male admitted for replacement aortic root and ascending aorta 04/26/2015.  Post-op developed s/s of stroke.  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   Pertinent Vitals Pain Assessment: Faces Faces Pain Scale: Hurts little more  SLP Plan  Continue with current plan of care    Recommendations Medication Administration: Via alternative means              General recommendations: Rehab consult Oral Care Recommendations: Oral care QID Follow up Recommendations: Inpatient Rehab Plan: Continue with current plan of care   Levi Shepard L. Tivis Ringer, [place] CCC/SLP Pager [phone]      Levi Shepard 04/19/2015, 11:11 AM

## 2015-04-19 NOTE — Progress Notes (Signed)
Called CT to check on patient status on list to get CT angiogram. Informed that patient was not showing up on their list. Gave travel information and was informed that they would call me back when sending for patient. Richardean Sale, RN

## 2015-04-19 NOTE — Progress Notes (Signed)
T[facility] BRIEF SICU PROGRESS NOTE  2 Days Post-Op  S/P Procedure(s) (LRB): BENTALL PROCEDURE (N/A) TRANSESOPHAGEAL ECHOCARDIOGRAM (TEE) (N/A) AORTIC VALVE REPLACEMENT (AVR) (N/A)   Lethargic but arousable AV paced w/ stable BP Excellent UOP CT pending  Plan: Continue current plan  Rexene Alberts 04/19/2015 9:40 PM

## 2015-04-19 NOTE — Progress Notes (Signed)
2 Days Post-Op Procedure(s) (LRB): BENTALL PROCEDURE (N/A) TRANSESOPHAGEAL ECHOCARDIOGRAM (TEE) (N/A) AORTIC VALVE REPLACEMENT (AVR) (N/A) Subjective: More lethargic today denies pain  Objective: Vital signs in last 24 hours: Temp:  [97.8 F (36.6 C)-100.2 F (37.9 C)] 98.6 F (37 C) (07/15 0400) Pulse Rate:  [69-90] 69 (07/15 0700) Cardiac Rhythm:  [-] Atrial paced (07/15 0700) Resp:  [8-27] 25 (07/15 0700) BP: (70-148)/(54-107) 141/85 mmHg (07/15 0700) SpO2:  [90 %-99 %] 95 % (07/15 0700) Arterial Line BP: (109-168)/(54-90) 157/77 mmHg (07/15 0600) Weight:  [195 lb 1.7 oz (88.5 kg)] 195 lb 1.7 oz (88.5 kg) (07/15 0500)  Hemodynamic parameters for last 24 hours: PAP: (33)/(22) 33/22 mmHg  Intake/Output from previous day: 07/14 0701 - 07/15 0700 In: 709.5 [I.V.:359.5; IV Piggyback:350] Out: 1610 [RUEAV:[id number]; Chest Tube:10] Intake/Output this shift:    General appearance: cooperative and slowed mentation Neurologic: more lethargic, r gaze preference, L sided weakness unchanged Heart: regular rate and rhythm Lungs: diminished breath sounds bibasilar Abdomen: normal findings: soft, non-tender  Lab Results:  Recent Labs  04/18/15 1615 04/18/15 1617 04/19/15 0350  WBC 10.1  --  11.9*  HGB 11.1* 10.9* 11.1*  HCT 32.7* 32.0* 32.6*  PLT 72*  --  74*   BMET:  Recent Labs  04/18/15 0450  04/18/15 1617 04/19/15 0350  NA 137  --  141 139  K 3.9  --  3.8 3.5  CL 107  --  104 108  CO2 22  --   --  24  GLUCOSE 154*  --  139* 122*  BUN 16  --  20 20  CREATININE 1.23  < > 0.90 0.92  CALCIUM 8.9  --   --  9.2  < > = values in this interval not displayed.  PT/INR:  Recent Labs  04/15/2015 1530  LABPROT 18.5*  INR 1.53*   ABG    Component Value Date/Time   PHART 7.447 04/18/2015 0335   HCO3 22.6 04/18/2015 0335   TCO2 21 04/18/2015 1617   ACIDBASEDEF 1.0 04/18/2015 0335   O2SAT 95.0 04/18/2015 0335   CBG (last 3)   Recent Labs  04/18/15 1929  04/18/15 2339 04/19/15 0353  GLUCAP 128* 114* 106*    Assessment/Plan: S/P Procedure(s) (LRB): BENTALL PROCEDURE (N/A) TRANSESOPHAGEAL ECHOCARDIOGRAM (TEE) (N/A) AORTIC VALVE REPLACEMENT (AVR) (N/A) -  NEURO- Right MCA infarct/ left hemiparesis  More lethargic today  To have repeat head CT   Stroke team to see. PT/OT/ Speech involved  CV- still paced but underlying rate is better today- in 50s  Continue atrial pacing  Will try to keep BP moderately high  RESP- poor cough- having trouble clearing secretions  NTS PRN  RENAL- replete K, diurese  ENDO- CBG well controlled  Thrombocytopenia- stable   LOS: 2 days    Levi Shepard 04/19/2015

## 2015-04-20 ENCOUNTER — Inpatient Hospital Stay (HOSPITAL_COMMUNITY): Payer: Medicare Other

## 2015-04-20 ENCOUNTER — Encounter (HOSPITAL_COMMUNITY): Payer: Self-pay

## 2015-04-20 DIAGNOSIS — G936 Cerebral edema: Secondary | ICD-10-CM | POA: Insufficient documentation

## 2015-04-20 LAB — BASIC METABOLIC PANEL
ANION GAP: 8 (ref 5–15)
BUN: 21 mg/dL — AB (ref 6–20)
CALCIUM: 9.1 mg/dL (ref 8.9–10.3)
CO2: 23 mmol/L (ref 22–32)
CREATININE: 0.93 mg/dL (ref 0.61–1.24)
Chloride: 109 mmol/L (ref 101–111)
GFR calc non Af Amer: >60 mL/min (ref >60)
Glucose, Bld: 125 mg/dL — ABNORMAL HIGH (ref 65–99)
Potassium: 3 mmol/L — ABNORMAL LOW (ref 3.5–5.1)
Sodium: 140 mmol/L (ref 135–145)

## 2015-04-20 LAB — CBC
HCT: 32.1 % — ABNORMAL LOW (ref 39.0–52.0)
HEMOGLOBIN: 11 g/dL — AB (ref 13.0–17.0)
MCH: 31.9 pg (ref 26.0–34.0)
MCHC: 34.3 g/dL (ref 30.0–36.0)
MCV: 93 fL (ref 78.0–100.0)
PLATELETS: 94 10*3/uL — AB (ref 150–400)
RBC: 3.45 MIL/uL — AB (ref 4.22–5.81)
RDW: 13.2 % (ref 11.5–15.5)
WBC: 10.4 10*3/uL (ref 4.0–10.5)

## 2015-04-20 LAB — LIPID PANEL
Cholesterol: 90 mg/dL (ref 0–200)
HDL: 22 mg/dL — ABNORMAL LOW (ref >40)
LDL Cholesterol: 46 mg/dL (ref 0–99)
Total CHOL/HDL Ratio: 4.1 RATIO
Triglycerides: 111 mg/dL (ref <150)
VLDL: 22 mg/dL (ref 0–40)

## 2015-04-20 LAB — SODIUM
SODIUM: 150 mmol/L — AB (ref 135–145)
Sodium: 146 mmol/L — ABNORMAL HIGH (ref 135–145)
Sodium: 147 mmol/L — ABNORMAL HIGH (ref 135–145)

## 2015-04-20 LAB — GLUCOSE, CAPILLARY
GLUCOSE-CAPILLARY: 103 mg/dL — AB (ref 65–99)
GLUCOSE-CAPILLARY: 108 mg/dL — AB (ref 65–99)
GLUCOSE-CAPILLARY: 108 mg/dL — AB (ref 65–99)
Glucose-Capillary: 107 mg/dL — ABNORMAL HIGH (ref 65–99)
Glucose-Capillary: 116 mg/dL — ABNORMAL HIGH (ref 65–99)
Glucose-Capillary: 137 mg/dL — ABNORMAL HIGH (ref 65–99)

## 2015-04-20 MED ORDER — IOHEXOL 350 MG/ML SOLN
50.0000 mL | Freq: Once | INTRAVENOUS | Status: AC | PRN
  Administered 2015-04-20: 50 mL via INTRAVENOUS

## 2015-04-20 MED ORDER — POTASSIUM CHLORIDE 20 MEQ/15ML (10%) PO SOLN
30.0000 meq | Freq: Once | ORAL | Status: AC
  Administered 2015-04-20: 30 meq via ORAL
  Filled 2015-04-20: qty 22.5

## 2015-04-20 MED ORDER — PANTOPRAZOLE SODIUM 40 MG PO PACK
40.0000 mg | PACK | Freq: Every day | ORAL | Status: DC
  Administered 2015-04-20 – 2015-04-23 (×4): 40 mg
  Filled 2015-04-20 (×5): qty 20

## 2015-04-20 MED ORDER — SODIUM CHLORIDE 3 % IV SOLN
INTRAVENOUS | Status: DC
  Administered 2015-04-20: 75 mL/h via INTRAVENOUS
  Administered 2015-04-21: 125 mL/h via INTRAVENOUS
  Administered 2015-04-21 (×2): 100 mL/h via INTRAVENOUS
  Administered 2015-04-21 – 2015-04-22 (×2): 125 mL/h via INTRAVENOUS
  Administered 2015-04-23: 50 mL/h via INTRAVENOUS
  Administered 2015-04-23: 125 mL/h via INTRAVENOUS
  Filled 2015-04-20 (×27): qty 500

## 2015-04-20 MED ORDER — SODIUM CHLORIDE 23.4 % INJECTION (4 MEQ/ML) FOR IV ADMINISTRATION
30.0000 mL | Freq: Once | INTRAVENOUS | Status: AC
  Administered 2015-04-20: 30 mL via INTRAVENOUS
  Filled 2015-04-20: qty 30

## 2015-04-20 MED ORDER — POTASSIUM CHLORIDE 10 MEQ/50ML IV SOLN
10.0000 meq | INTRAVENOUS | Status: AC
  Filled 2015-04-20: qty 50

## 2015-04-20 MED ORDER — POTASSIUM CHLORIDE 10 MEQ/50ML IV SOLN
10.0000 meq | INTRAVENOUS | Status: AC | PRN
  Administered 2015-04-20 (×3): 10 meq via INTRAVENOUS
  Filled 2015-04-20 (×4): qty 50

## 2015-04-20 NOTE — Progress Notes (Addendum)
[address]       [place] [address]             [phone]        CARDIOTHORACIC SURGERY PROGRESS NOTE   R3 Days Post-Op Procedure(s) (LRB): BENTALL PROCEDURE (N/A) TRANSESOPHAGEAL ECHOCARDIOGRAM (TEE) (N/A) AORTIC VALVE REPLACEMENT (AVR) (N/A)  Subjective: Lethargic but arousable. Follows some simple commands but not moving left side.  Difficulty managing airway secretions, requiring intermittent NT suctioning.  Objective: Vital signs: BP Readings from Last 1 Encounters:  04/20/15 138/82   Pulse Readings from Last 1 Encounters:  04/20/15 78   Resp Readings from Last 1 Encounters:  04/20/15 19   Temp Readings from Last 1 Encounters:  04/20/15 97.5 F (36.4 C) Oral    Hemodynamics:    Physical Exam:  Rhythm:   Sinus brady 40's - AAI pacing  Breath sounds: Coarse rhonchi  Heart sounds:  RRR  Incisions:  Clean and dry  Abdomen:  Soft, non-distended, non-tender  Extremities:  Warm, well-perfused    Intake/Output from previous day: 07/15 0701 - 07/16 0700 In: 1725 [I.V.:230; NG/GT:1345; IV Piggyback:150] Out: 3750 [Urine:3750] Intake/Output this shift: Total I/O In: 220 [I.V.:10; NG/GT:160; IV Piggyback:50] Out: 300 [Urine:300]  Lab Results:  CBC: Recent Labs  04/19/15 0350 04/20/15 0400  WBC 11.9* 10.4  HGB 11.1* 11.0*  HCT 32.6* 32.1*  PLT 74* 94*    BMET:  Recent Labs  04/19/15 0350 04/20/15 0400  NA 139 140  K 3.5 3.0*  CL 108 109  CO2 24 23  GLUCOSE 122* 125*  BUN 20 21*  CREATININE 0.92 0.93  CALCIUM 9.2 9.1     PT/INR:   Recent Labs  04/23/2015 1530  LABPROT 18.5*  INR 1.53*    CBG (last 3)   Recent Labs  04/20/15 0006 04/20/15 0411 04/20/15 0807  GLUCAP 107* 103* 116*    ABG    Component Value Date/Time   PHART 7.447 04/18/2015 0335   PCO2ART 32.5* 04/18/2015 0335   PO2ART 67.0* 04/18/2015 0335   HCO3 22.6 04/18/2015 0335   TCO2 21 04/18/2015 1617   ACIDBASEDEF 1.0 04/18/2015 0335     O2SAT 95.0 04/18/2015 0335    CXR: PORTABLE CHEST - 1 VIEW  COMPARISON: 04/19/2015  FINDINGS: Lung base opacity is mildly increased from the prior study, most likely combination of pleural effusions and atelectasis. No overt pulmonary edema.  A pneumothorax.  Cardiac silhouette is borderline enlarged. No mediastinal widening.  Right internal jugular Cordis is stable in well positioned. New enteric feeding tube passes below the diaphragm and below the included field of view.  IMPRESSION: 1. Increased lung base opacity most consistent with increased atelectasis and small effusions. 2. No pulmonary edema. No mediastinal widening. No pneumothorax.   Electronically Signed  By: Lajean Manes M.D.  On: 04/20/2015 07:33   CT ANGIOGRAPHY HEAD  TECHNIQUE: Multidetector CT imaging of the head was performed using the standard protocol during bolus administration of intravenous contrast. Multiplanar CT image reconstructions and MIPs were obtained to evaluate the vascular anatomy.  CONTRAST: 76mL OMNIPAQUE IOHEXOL 350 MG/ML SOLN  COMPARISON: Head CT without contrast 04/18/2015  FINDINGS: CT HEAD  Brain: Evolution of widespread right hemisphere cytotoxic edema. The pattern suggests complete right MCA territory involvement. Associated increased intracranial mass effect with partial effacement of the right lateral ventricle and midline shift canal of 8 mm. No associated hemorrhage identified. Basilar cisterns remain patent. No ventriculomegaly. Gray-white matter differentiation elsewhere is stable.  Calvarium and skull base: Intact.  Paranasal sinuses: Stable bubbly opacity in the left maxillary sinus. Left side NG tube in place. Tympanic cavities and mastoids are clear.  Orbits: Visualized orbit soft tissues are within normal limits. Small volume subcutaneous gas in the left lateral upper neck/ suboccipital soft tissues is new (series 202,  image 1).  CTA HEAD  Posterior circulation: Codominant distal vertebral arteries are patent. Patent PICA origins. Tortuous vertebrobasilar junction is patent without stenosis. No basilar artery stenosis. SCA and right PCA origins are normal. Fetal type left PCA origin. Bilateral PCA branches are within normal limits. Right posterior communicating artery diminutive or absent.  Anterior circulation: Patent distal cervical ICAs. The right is mildly smaller than the left. Both ICA siphons are patent with minimal calcified plaque. No siphon stenosis. Both carotid termini remain patent.  Dominant appearance of the left ACA A1 segment. Diminutive right A1 appears mildly irregular. Anterior communicating artery and bilateral ACA branches are within normal limits. Left MCA M1 segment, bifurcation, and left MCA branches are within normal limits.  Right MCA origin is patent but mildly irregular. Mildly diminutive and mildly to moderately irregular right MCA M1 segment remains patent proximally, but occludes just proximal to the bifurcation (series 501, image 34 and series 505, image 15). There is moderate to poor collateral flow in the most right MCA M2 branches. There is slightly better preserved collateral flow in the dominant posterior sylvian division (series 507, image 14).  Venous sinuses: Patent.  Anatomic variants: Fetal type left PCA origin. Dominant left A1 segment.  Delayed phase:No abnormal enhancement identified.  IMPRESSION: 1. Occlusion of the distal right MCA M1 segment with moderate to poor right MCA territory collateral flow. 2. Evolved complete right MCA territory infarct with increased cytotoxic edema and mass effect. Leftward midline shift now 8 mm. No associated hemorrhage. 3. Otherwise negative for age arterial findings on intracranial CTA. 4. No new intracranial abnormality.   Electronically Signed  By: Genevie Ann M.D.  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 Leonie Man and I had an extensive conversation with the patient's daughter and family at the bedside.  Patient still has a reasonable chance for a meaningful recovery but prognosis is guarded at best.  Per the patient's daughter and family's request the patient is to be made NO CODE BLUE with specific instructions not to reintubate, perform CPR or perform DC cardioversion in the event of cardiac or respiratory arrest.  The patient will otherwise be treated aggressively.   Levi Shepard 04/20/2015 10:11 AM

## 2015-04-20 NOTE — Progress Notes (Signed)
OT Cancellation Note  Patient Details Name: Levi Shepard MRN: [id number] DOB: 01-Mar-1941   Cancelled Treatment:    Reason Eval/Treat Not Completed: Patient not medically ready. Per PT note in speaking to pt's RN, pt is not ready for therapy as of yet, will re-check on pt's status on Monday (04/22/15)  Almon Register [phone] 04/20/2015, 2:27 PM

## 2015-04-20 NOTE — Progress Notes (Signed)
[facility] BRIEF SICU PROGRESS NOTE  3 Days Post-Op  S/P Procedure(s) (LRB): BENTALL PROCEDURE (N/A) TRANSESOPHAGEAL ECHOCARDIOGRAM (TEE) (N/A) AORTIC VALVE REPLACEMENT (AVR) (N/A)   Lethargic but still opens eyes on command AAI paced w/ stable BP Breathing labored but stable Excellent UOP Serum sodium up to 147  Plan: Continue current plan  Rexene Alberts 04/20/2015 6:21 PM

## 2015-04-20 NOTE — Progress Notes (Signed)
STROKE TEAM PROGRESS NOTE   HISTORY Levi Shepard is a 74 y.o. 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 Upon waking this AM patient was noted to have a right gaze preference, inability to squeeze left hand, left facial droop and weakness of left leg. 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   Date last known well: Date: 04/18/2015 Time last known well: Unable to determine tPA Given: No: post op AO root replacement, unknown LKW Modified Rankin: Rankin Score=0   SUBJECTIVE (INTERVAL HISTORY) Patient's daughter is at the bedside. The patient is more lethargic and sleepy today but can be aroused with some difficulty and he is able to follow some simple commands. CT angiogram shows distal M1 occlusion with poor collateral flow and CT scan shows increased cytotoxic edema with 8 mm right to left midline shift   OBJECTIVE Temp:  [97.5 F (36.4 C)-99.7 F (37.6 C)] 97.5 F (36.4 C) (07/16 0810) Pulse Rate:  [69-81] 78 (07/16 0900) Cardiac Rhythm:  [-] Atrial paced (07/16 0800) Resp:  [16-32] 19 (07/16 0900) BP: (109-154)/(51-104) 138/82 mmHg (07/16 0900) SpO2:  [91 %-96 %] 93 % (07/16 0900) Arterial Line BP: (105-185)/(66-99) 143/76 mmHg (07/16 0800) Weight:  [193 lb 9 oz (87.8 kg)] 193 lb 9 oz (87.8 kg) (07/16 0500)   Recent Labs Lab 04/19/15 1637 04/19/15 1939 04/20/15 0006 04/20/15 0411 04/20/15 0807  GLUCAP 128* 129* 107* 103* 116*    Recent Labs Lab 04/22/2015 2123 04/11/2015 2130 04/18/15 0450 04/18/15 1615 04/18/15 1617 04/19/15 0350 04/20/15 0400  NA  --  137 137  --  141 139 140  K  --  3.7 3.9  --  3.8 3.5 3.0*  CL  --  112* 107  --  104 108 109  CO2  --   --  22  --   --  24 23  GLUCOSE  --  133* 154*  --  139* 122* 125*  BUN  --  17 16  --  20 20 21*   CREATININE 1.34* 1.10 1.23 1.03 0.90 0.92 0.93  CALCIUM  --   --  8.9  --   --  9.2 9.1  MG 2.6*  --  2.1 1.9  --   --   --    No results for input(s): AST, ALT, ALKPHOS, BILITOT, PROT, ALBUMIN in the last 168 hours.  Recent Labs Lab 04/19/2015 2123  04/18/15 0450 04/18/15 1615 04/18/15 1617 04/19/15 0350 04/20/15 0400  WBC 12.7*  --  10.7* 10.1  --  11.9* 10.4  HGB 12.7*  < > 11.9* 11.1* 10.9* 11.1* 11.0*  HCT 37.3*  < > 34.6* 32.7* 32.0* 32.6* 32.1*  MCV 91.6  --  91.8 92.1  --  92.1 93.0  PLT 66*  --  71* 72*  --  74* 94*  < > = values in this interval not displayed. No results for input(s): CKTOTAL, CKMB, CKMBINDEX, TROPONINI in the last 168 hours.  Recent Labs  04/25/2015 1530  LABPROT 18.5*  INR 1.53*   No results for input(s): COLORURINE, LABSPEC, PHURINE, GLUCOSEU, HGBUR, BILIRUBINUR, KETONESUR, PROTEINUR, UROBILINOGEN, NITRITE, LEUKOCYTESUR in the last 72 hours.  Invalid input(s): APPERANCEUR     Component Value Date/Time   CHOL 90 04/20/2015 0400   TRIG 111 04/20/2015 0400   HDL 22*  04/20/2015 0400   CHOLHDL 4.1 04/20/2015 0400   VLDL 22 04/20/2015 0400   LDLCALC 46 04/20/2015 0400   Lab Results  Component Value Date   HGBA1C 5.8* 04/11/2015   No results found for: LABOPIA, COCAINSCRNUR, LABBENZ, AMPHETMU, THCU, LABBARB  No results for input(s): ETH in the last 168 hours.   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      Dg Chest Port 1 View 04/19/2015    Decreased right basilar atelectasis.  Persistent left retrocardiac atelectasis versus consolidation. No pneumothorax visualized.     Dg Chest Port 1 View 04/18/2015    Increase in bibasilar atelectasis and effusion following extubation. No pneumothorax.      Dg Chest Port 1 View 04/07/2015    Postsurgical change with mild left basilar atelectasis.  Tubes and lines as described.       Dg Abd Portable  1v 04/18/2015    Feeding tube tip overlies the body of the stomach.     CT angio brain 04/20/15 : 1. Occlusion of the distal right MCA M1 segment with moderate to poor right MCA territory collateral flow. 2. Evolved complete right MCA territory infarct with increased cytotoxic edema and mass effect. Leftward midline shift now 8 mm. No associated hemorrhage.  PHYSICAL EXAM  Frail elderly caucasian male not in distress. . Afebrile. Head is nontraumatic. Neck is supple without bruit.    Cardiac exam no murmur or gallop. Lungs are clear to auscultation. Distal pulses are well felt. Has incision from recent surgery and bandages on the chest and tubes post op. Neurological Exam :  Lethargic obtunded but can easily aroused   and follow commands. Right gaze preference but able to look to the left past midline but not all the way. Pupils 3 mm equal reactive. Blinks to threat on the right but not on the left. Fundi were not visualized. Vision acuity cannot be reliably tested. Left lower facial weakness. Tongue midline. Left hemiplegia with 0/5 left upper extremity strength and hypotonia. Left lower extremity strength is 2/5. Diminished left hemibody sensation and mild left hemi-neglect. Unable to recognize his own left hand. Normal and purposeful movements on the right side.   ASSESSMENT/PLAN [name] is a 74 y.o. 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  He did not receive IV t-PA due to recent surgery and unknown time of onset.  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  Resultant  left hemiparesis and left neglect and now increasing cytotoxic edema with left-to-right trans-falcine brain herniation  MRI  not performed  MRA  not performed  CT Head - Extensive nonhemorrhagic right middle cerebral artery infarct with 8 mm of midline  shift.   CT angio 04/20/15 : Occluded distal right M1  Carotid Doppler Bilateral: mild mixed plaque origin ICA. 1-39% ICA stenosis. Vertebral artery flow is antegrade.   2D Echo - EF 50-55%. No cardiac source of emboli identified.  Intraop TEE - 04/25/2015 -  No report    LDL 46  HgbA1c 5.8  SCDs for VTE prophylaxis Diet NPO time specified Except for: Sips with Meds  aspirin 81 mg orally every day prior to admission, now on aspirin 300 mg suppository daily  Ongoing aggressive stroke risk factor management  Therapy recommendations: Pending  Disposition:  Pending  Hypertension  Home meds:  No antihypertensives medications prior to admission  Stable  Now on metoprolol   Hyperlipidemia  Home meds:  Fenofibrate and lovastatin - now on Pravachol and fenofibrate  LDL 5.8, goal < 70  Continue statin at discharge    Other Stroke Risk Factors  Advanced age  ETOH use  Recent aortic root dissection and subsequent replacement  Other Active Problems  Mild anemia  Other Pertinent History    Hospital day # 3     He unfortunately has a right MCA large infarct during the perioperative period and has significant deficits and remains at risk for increasing cerebral edema, neurological worsening, recurrent stroke, TIAs . I had a long discussion with the patient's daughter at the bedside about his neurological condition, prognosis, plan for evaluation, treatment and answered questions.  The patient has clearly increasing cytotoxic edema and needs aggressive measures to control cerebral edema and prevent death.. The patient can potentially survive this with hypertonic saline and reintubation and ventilatory support. I had a long discussion with the patient's daughter and she was very clear that patient would not want prolonged ventilatory support but she prefers to have aggressive medical management short of intubation. She wants him to be DO NOT RESUSCITATE but pursue all  other aggressive measures. We will hence start the patient on hypertonic saline and aim. for sodium goal of 150-155. Repeat CT scan in the morning. Discussed with Dr. Roxy Manns and answered questions This patient is critically ill and at significant risk of neurological worsening, death and care requires constant monitoring of vital signs, hemodynamics,respiratory and cardiac monitoring, extensive review of multiple databases, frequent neurological assessment, discussion with family, other specialists and medical decision making of high complexity.I have made any additions or clarifications directly to the above note.This critical care time does not reflect procedure time, or teaching time or supervisory time of PA/NP/Med Resident etc but could involve care discussion time.  I spent 40 minutes of neurocritical care time  in the care of  this patient.  Antony Contras, MD    To contact Stroke Continuity provider, please refer to [email or web address] After hours, contact General Neurology

## 2015-04-20 NOTE — Progress Notes (Signed)
PT Cancellation Note  Patient Details Name: Levi Shepard MRN: [id number] DOB: [birth year]   Cancelled Treatment:    Reason Eval/Treat Not Completed: Patient not medically ready. Discussed pt case with RN who states that pt is not appropriate for PT eval at this time. Will continue to follow and assess for medical readiness to participate with therapy.   Rolinda Roan 04/20/2015, 1:56 PM   Rolinda Roan, PT, DPT Acute Rehabilitation Services Pager: [phone]

## 2015-04-21 ENCOUNTER — Inpatient Hospital Stay (HOSPITAL_COMMUNITY): Payer: Medicare Other

## 2015-04-21 DIAGNOSIS — J690 Pneumonitis due to inhalation of food and vomit: Secondary | ICD-10-CM | POA: Insufficient documentation

## 2015-04-21 DIAGNOSIS — J988 Other specified respiratory disorders: Secondary | ICD-10-CM | POA: Insufficient documentation

## 2015-04-21 LAB — POCT I-STAT 3, VENOUS BLOOD GAS (G3P V)
Acid-base deficit: 3 mmol/L — ABNORMAL HIGH (ref 0.0–2.0)
BICARBONATE: 20.7 meq/L (ref 20.0–24.0)
O2 SAT: 85 %
PCO2 VEN: 32.2 mmHg — AB (ref 45.0–50.0)
PH VEN: 7.415 — AB (ref 7.250–7.300)
Patient temperature: 98
TCO2: 22 mmol/L (ref 0–100)
pO2, Ven: 47 mmHg — ABNORMAL HIGH (ref 30.0–45.0)

## 2015-04-21 LAB — BASIC METABOLIC PANEL
ANION GAP: 5 (ref 5–15)
BUN: 21 mg/dL — ABNORMAL HIGH (ref 6–20)
CALCIUM: 9 mg/dL (ref 8.9–10.3)
CO2: 23 mmol/L (ref 22–32)
Chloride: 121 mmol/L — ABNORMAL HIGH (ref 101–111)
Creatinine, Ser: 0.77 mg/dL (ref 0.61–1.24)
GLUCOSE: 98 mg/dL (ref 65–99)
Potassium: 3.6 mmol/L (ref 3.5–5.1)
Sodium: 149 mmol/L — ABNORMAL HIGH (ref 135–145)

## 2015-04-21 LAB — CBC
HEMATOCRIT: 32.5 % — AB (ref 39.0–52.0)
Hemoglobin: 10.9 g/dL — ABNORMAL LOW (ref 13.0–17.0)
MCH: 31.6 pg (ref 26.0–34.0)
MCHC: 33.5 g/dL (ref 30.0–36.0)
MCV: 94.2 fL (ref 78.0–100.0)
Platelets: 141 10*3/uL — ABNORMAL LOW (ref 150–400)
RBC: 3.45 MIL/uL — ABNORMAL LOW (ref 4.22–5.81)
RDW: 13.7 % (ref 11.5–15.5)
WBC: 10.3 10*3/uL (ref 4.0–10.5)

## 2015-04-21 LAB — POCT I-STAT 3, ART BLOOD GAS (G3+)
Acid-base deficit: 2 mmol/L (ref 0.0–2.0)
Bicarbonate: 21.7 mEq/L (ref 20.0–24.0)
O2 SAT: 100 %
PCO2 ART: 30.9 mmHg — AB (ref 35.0–45.0)
PH ART: 7.453 — AB (ref 7.350–7.450)
Patient temperature: 98
TCO2: 23 mmol/L (ref 0–100)
pO2, Arterial: 209 mmHg — ABNORMAL HIGH (ref 80.0–100.0)

## 2015-04-21 LAB — GLUCOSE, CAPILLARY
GLUCOSE-CAPILLARY: 93 mg/dL (ref 65–99)
GLUCOSE-CAPILLARY: 120 mg/dL — AB (ref 65–99)
Glucose-Capillary: 112 mg/dL — ABNORMAL HIGH (ref 65–99)
Glucose-Capillary: 122 mg/dL — ABNORMAL HIGH (ref 65–99)
Glucose-Capillary: 141 mg/dL — ABNORMAL HIGH (ref 65–99)
Glucose-Capillary: 98 mg/dL (ref 65–99)

## 2015-04-21 LAB — SODIUM
Sodium: 153 mmol/L — ABNORMAL HIGH (ref 135–145)
Sodium: 159 mmol/L — ABNORMAL HIGH (ref 135–145)
Sodium: 157 mmol/L — ABNORMAL HIGH (ref 135–145)

## 2015-04-21 LAB — TRIGLYCERIDES: TRIGLYCERIDES: 93 mg/dL (ref <150)

## 2015-04-21 MED ORDER — POTASSIUM CHLORIDE 20 MEQ/15ML (10%) PO SOLN
40.0000 meq | Freq: Once | ORAL | Status: AC
  Administered 2015-04-21: 40 meq via ORAL
  Filled 2015-04-21: qty 30

## 2015-04-21 MED ORDER — PROPOFOL 1000 MG/100ML IV EMUL
0.0000 ug/kg/min | INTRAVENOUS | Status: DC
  Administered 2015-04-21: 5 ug/kg/min via INTRAVENOUS
  Administered 2015-04-21: 20 ug/kg/min via INTRAVENOUS
  Administered 2015-04-22: 30 ug/kg/min via INTRAVENOUS
  Administered 2015-04-22 – 2015-04-23 (×2): 20 ug/kg/min via INTRAVENOUS
  Filled 2015-04-21 (×5): qty 100

## 2015-04-21 MED ORDER — FENTANYL CITRATE (PF) 100 MCG/2ML IJ SOLN
INTRAMUSCULAR | Status: AC
  Administered 2015-04-21: 100 ug
  Filled 2015-04-21: qty 2

## 2015-04-21 MED ORDER — VANCOMYCIN HCL IN DEXTROSE 1-5 GM/200ML-% IV SOLN
1000.0000 mg | Freq: Three times a day (TID) | INTRAVENOUS | Status: DC
  Administered 2015-04-21 – 2015-04-24 (×9): 1000 mg via INTRAVENOUS
  Filled 2015-04-21 (×11): qty 200

## 2015-04-21 MED ORDER — CHLORHEXIDINE GLUCONATE 0.12 % MT SOLN
15.0000 mL | Freq: Two times a day (BID) | OROMUCOSAL | Status: DC
  Administered 2015-04-21 – 2015-04-24 (×6): 15 mL via OROMUCOSAL
  Filled 2015-04-21 (×6): qty 15

## 2015-04-21 MED ORDER — PIPERACILLIN-TAZOBACTAM 3.375 G IVPB
3.3750 g | Freq: Three times a day (TID) | INTRAVENOUS | Status: DC
Start: 2015-04-21 — End: 2015-04-24
  Administered 2015-04-21 – 2015-04-24 (×9): 3.375 g via INTRAVENOUS
  Filled 2015-04-21 (×11): qty 50

## 2015-04-21 MED ORDER — MIDAZOLAM HCL 2 MG/2ML IJ SOLN
INTRAMUSCULAR | Status: AC
  Administered 2015-04-21: 2 mg
  Filled 2015-04-21: qty 2

## 2015-04-21 MED ORDER — POTASSIUM CHLORIDE 20 MEQ/15ML (10%) PO SOLN
ORAL | Status: AC
  Administered 2015-04-21: 40 meq
  Filled 2015-04-21: qty 30

## 2015-04-21 MED ORDER — FENTANYL CITRATE (PF) 100 MCG/2ML IJ SOLN
50.0000 ug | INTRAMUSCULAR | Status: DC | PRN
  Administered 2015-04-22 – 2015-04-24 (×8): 50 ug via INTRAVENOUS
  Filled 2015-04-21 (×9): qty 2

## 2015-04-21 MED ORDER — ENOXAPARIN SODIUM 30 MG/0.3ML ~~LOC~~ SOLN
30.0000 mg | SUBCUTANEOUS | Status: DC
  Administered 2015-04-21 – 2015-04-24 (×3): 30 mg via SUBCUTANEOUS
  Filled 2015-04-21 (×4): qty 0.3

## 2015-04-21 MED ORDER — CETYLPYRIDINIUM CHLORIDE 0.05 % MT LIQD
7.0000 mL | Freq: Four times a day (QID) | OROMUCOSAL | Status: DC
  Administered 2015-04-21 – 2015-04-24 (×13): 7 mL via OROMUCOSAL

## 2015-04-21 NOTE — Progress Notes (Signed)
STROKE TEAM PROGRESS NOTE   HISTORY Levi Shepard is a 74 y.o. 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 Upon waking this AM patient was noted to have a right gaze preference, inability to squeeze left hand, left facial droop and weakness of left leg. 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   Date last known well: Date: 04/18/2015 Time last known well: Unable to determine tPA Given: No: post op AO root replacement, unknown LKW Modified Rankin: Rankin Score=0   SUBJECTIVE (INTERVAL HISTORY) Patient's daughter is at the bedside. The patient remains sleepy but can be easily aroused without difficulty and he is able to follow some simple commands. Repeat head CT  shows stable large right MCA infarct and 9 mm right-to-left midline shift. Serum sodium is at goal on hypertonic saline  OBJECTIVE Temp:  [97.2 F (36.2 C)-99.1 F (37.3 C)] 98 F (36.7 C) (07/17 1159) Pulse Rate:  [78-82] 82 (07/17 1200) Cardiac Rhythm:  [-] Atrial paced (07/17 1200) Resp:  [16-27] 16 (07/17 1200) BP: (119-173)/(66-101) 141/90 mmHg (07/17 1200) SpO2:  [98 %-100 %] 99 % (07/17 1200) FiO2 (%):  [100 %] 100 % (07/17 1200) Weight:  [191 lb 12.8 oz (87 kg)] 191 lb 12.8 oz (87 kg) (07/17 0356)   Recent Labs Lab 04/20/15 2005 04/21/15 0013 04/21/15 0400 04/21/15 0803 04/21/15 1157  GLUCAP 137* 141* 93 122* 98    Recent Labs Lab 04/10/2015 2123  04/18/15 0450 04/18/15 1615 04/18/15 1617 04/19/15 0350 04/20/15 0400 04/20/15 1435 04/20/15 1720 04/20/15 2240 04/21/15 0412 04/21/15 0925  NA  --   < > 137  --  141 139 140 146* 147* 150* 149* 153*  K  --   < > 3.9  --  3.8 3.5 3.0*  --   --   --  3.6  --   CL  --   < > 107  --  104 108 109  --   --   --  121*  --   CO2  --   --  22  --   --  24 23   --   --   --  23  --   GLUCOSE  --   < > 154*  --  139* 122* 125*  --   --   --  98  --   BUN  --   < > 16  --  20 20 21*  --   --   --  21*  --   CREATININE 1.34*  < > 1.23 1.03 0.90 0.92 0.93  --   --   --  0.77  --   CALCIUM  --   < > 8.9  --   --  9.2 9.1  --   --   --  9.0  --   MG 2.6*  --  2.1 1.9  --   --   --   --   --   --   --   --   < > = values in this interval not displayed. No results for input(s): AST, ALT, ALKPHOS, BILITOT, PROT, ALBUMIN in the last 168 hours.  Recent Labs Lab 04/18/15 0450 04/18/15 1615 04/18/15 1617 04/19/15 0350 04/20/15 0400 04/21/15 0412  WBC 10.7* 10.1  --  11.9* 10.4 10.3  HGB 11.9* 11.1* 10.9* 11.1* 11.0* 10.9*  HCT 34.6* 32.7* 32.0* 32.6* 32.1* 32.5*  MCV 91.8 92.1  --  92.1 93.0 94.2  PLT 71* 72*  --  74* 94* 141*   No results for input(s): CKTOTAL, CKMB, CKMBINDEX, TROPONINI in the last 168 hours. No results for input(s): LABPROT, INR in the last 72 hours. No results for input(s): COLORURINE, LABSPEC, [place], GLUCOSEU, HGBUR, BILIRUBINUR, KETONESUR, PROTEINUR, UROBILINOGEN, NITRITE, LEUKOCYTESUR in the last 72 hours.  Invalid input(s): APPERANCEUR     Component Value Date/Time   CHOL 90 04/20/2015 0400   TRIG 111 04/20/2015 0400   HDL 22* 04/20/2015 0400   CHOLHDL 4.1 04/20/2015 0400   VLDL 22 04/20/2015 0400   LDLCALC 46 04/20/2015 0400   Lab Results  Component Value Date   HGBA1C 5.8* 04/11/2015   No results found for: LABOPIA, COCAINSCRNUR, LABBENZ, AMPHETMU, THCU, LABBARB  No results for input(s): ETH in the last 168 hours.   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      Dg Chest Port 1 View 04/19/2015    Decreased right basilar atelectasis.  Persistent left retrocardiac atelectasis versus consolidation. No pneumothorax visualized.     Dg Chest Port 1 View 04/18/2015    Increase in bibasilar atelectasis and effusion  following extubation. No pneumothorax.      Dg Chest Port 1 View 05/05/2015    Postsurgical change with mild left basilar atelectasis.  Tubes and lines as described.       Dg Abd Portable 1v 04/18/2015    Feeding tube tip overlies the body of the stomach.     CT angio brain 04/20/15 : 1. Occlusion of the distal right MCA M1 segment with moderate to poor right MCA territory collateral flow. 2. Evolved complete right MCA territory infarct with increased cytotoxic edema and mass effect. Leftward midline shift now 8 mm. No associated hemorrhage.  PHYSICAL EXAM  Frail elderly caucasian male not in distress. . Afebrile. Head is nontraumatic. Neck is supple without bruit.    Cardiac exam no murmur or gallop. Lungs are clear to auscultation. Distal pulses are well felt. Has incision from recent surgery and bandages on the chest and tubes post op. Neurological Exam :  Eyes closed Eye opening aparxia   but can easily aroused   and follow commands. Right gaze preference but able to look to the left past midline but not all the way. Pupils 3 mm equal reactive. Blinks to threat on the right but not on the left. Fundi were not visualized. Vision acuity cannot be reliably tested. Left lower facial weakness. Tongue midline. Left hemiplegia with 0/5 left upper extremity strength and hypotonia. Left lower extremity strength is 2/5. Diminished left hemibody sensation and mild left hemi-neglect. Unable to recognize his own left hand. Normal and purposeful movements on the right side.   ASSESSMENT/PLAN [name] is a 74 y.o. 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  He did not receive IV t-PA due to recent surgery and unknown time of onset.  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  Resultant  left hemiparesis and left neglect and now  increasing cytotoxic edema with left-to-right trans-falcine brain herniation  MRI  not performed  MRA  not performed  CT Head - Extensive nonhemorrhagic right middle cerebral  artery infarct with 8 mm of midline shift.   CT angio 04/20/15 : Occluded distal right M1  Carotid Doppler Bilateral: mild mixed plaque origin ICA. 1-39% ICA stenosis. Vertebral artery flow is antegrade.   2D Echo - EF 50-55%. No cardiac source of emboli identified.  Intraop TEE - 04/22/2015 -  No report    LDL 46  HgbA1c 5.8  SCDs for VTE prophylaxis Diet NPO time specified  aspirin 81 mg orally every day prior to admission, now on aspirin 300 mg suppository daily  Ongoing aggressive stroke risk factor management  Therapy recommendations: Pending  Disposition:  Pending  Hypertension  Home meds:  No antihypertensives medications prior to admission  Stable  Now on metoprolol   Hyperlipidemia  Home meds:  Fenofibrate and lovastatin - now on Pravachol and fenofibrate  LDL 5.8, goal < 70  Continue statin at discharge    Other Stroke Risk Factors  Advanced age  ETOH use  Recent aortic root dissection and subsequent replacement  Other Active Problems  Mild anemia  Induced Hypernatremia- sodium goal 150-155 on 3 % saline  Other Pertinent History    Hospital day # 4     He unfortunately has a right MCA large infarct during the perioperative period and has significant deficits and remains at risk for increasing cerebral edema, neurological worsening, recurrent stroke, TIAs . I had a long discussion with the patient's daughter at the bedside about his neurological condition, prognosis, plan for evaluation, treatment and answered questions.  The patient has clearly increasing cytotoxic edema and needs aggressive measures to control cerebral edema and prevent death.. The patient can potentially survive this with hypertonic saline and reintubation and ventilatory support. I had a long  discussion with the patient's daughter and she was very clear that patient would not want prolonged ventilatory support but she prefers to have aggressive medical management short of intubation. She wants him to be DO NOT RESUSCITATE but pursue all other aggressive measures.. Patient is having poor ability to handle his oral secretions and is at  risk for aspiration and respiratory failure. I had a long discussion with the daughter and discuss short-term intubation and she was agreeable to that but does not want prolonged ventilatory support, tracheostomy or PEG tube. This patient is critically ill and at significant risk of neurological worsening, death and care requires constant monitoring of vital signs, hemodynamics,respiratory and cardiac monitoring, extensive review of multiple databases, frequent neurological assessment, discussion with family, other specialists and medical decision making of high complexity.I have made any additions or clarifications directly to the above note.This critical care time does not reflect procedure time, or teaching time or supervisory time of PA/NP/Med Resident etc but could involve care discussion time.  I spent 45 minutes of neurocritical care time  in the care of  this patient.  Antony Contras, MD    To contact Stroke Continuity provider, please refer to [email or web address] After hours, contact General Neurology

## 2015-04-21 NOTE — Progress Notes (Signed)
TCTS BRIEF SICU PROGRESS NOTE  4 Days Post-Op  S/P Procedure(s) (LRB): BENTALL PROCEDURE (N/A) TRANSESOPHAGEAL ECHOCARDIOGRAM (TEE) (N/A) AORTIC VALVE REPLACEMENT (AVR) (N/A)   Now lightly sedated and intubated on vent Looks much more comfortable NSR w/ stable vitals Excellent UOP Serum sodium up to 159 PH 7.45 and PCO2 31 on vent w/ increased minute volume  Plan: Continue current plan  Rexene Alberts 04/21/2015 5:40 PM

## 2015-04-21 NOTE — Progress Notes (Signed)
Sputum sample obtained and sent down to main lab without any complications.  Will continue to monitor.

## 2015-04-21 NOTE — Progress Notes (Addendum)
[address]       [place] [address]             [phone]        CARDIOTHORACIC SURGERY PROGRESS NOTE   R4 Days Post-Op Procedure(s) (LRB): BENTALL PROCEDURE (N/A) TRANSESOPHAGEAL ECHOCARDIOGRAM (TEE) (N/A) AORTIC VALVE REPLACEMENT (AVR) (N/A)  Subjective: More somnolent and disoriented but still answers some questions and follows some simple commands.    Objective: Vital signs: BP Readings from Last 1 Encounters:  04/21/15 160/93   Pulse Readings from Last 1 Encounters:  04/21/15 81   Resp Readings from Last 1 Encounters:  04/21/15 20   Temp Readings from Last 1 Encounters:  04/21/15 99 F (37.2 C) Oral    Hemodynamics:    Physical Exam:  Rhythm:   AAI paced  Breath sounds: Coarse rhonchi - not mobilizing secretions and requiring frequent suctioning  Heart sounds:  RRR  Incisions:  Clean and dry  Abdomen:  Soft, non-distended, non-tender  Extremities:  Warm, well-perfused  Neuro:   PERRL, not moving left side, more somnolent and confused but still purposeful, speaking and following simple commands    Intake/Output from previous day: 07/16 0701 - 07/17 0700 In: 4075 [I.V.:1965; NG/GT:2060; IV Piggyback:50] Out: 3860 [Urine:3860] Intake/Output this shift: Total I/O In: 220 [I.V.:125; NG/GT:95] Out: 150 [Urine:150]  Lab Results:  CBC: Recent Labs  04/20/15 0400 04/21/15 0412  WBC 10.4 10.3  HGB 11.0* 10.9*  HCT 32.1* 32.5*  PLT 94* 141*    BMET:  Recent Labs  04/20/15 0400  04/20/15 2240 04/21/15 0412  NA 140  < > 150* 149*  K 3.0*  --   --  3.6  CL 109  --   --  121*  CO2 23  --   --  23  GLUCOSE 125*  --   --  98  BUN 21*  --   --  21*  CREATININE 0.93  --   --  0.77  CALCIUM 9.1  --   --  9.0  < > = values in this interval not displayed.   PT/INR:  No results for input(s): LABPROT, INR in the last 72 hours.  CBG (last 3)   Recent Labs  04/21/15 0013 04/21/15 0400 04/21/15 0803  GLUCAP 141* 93  122*    ABG    Component Value Date/Time   PHART 7.447 04/18/2015 0335   PCO2ART 32.5* 04/18/2015 0335   PO2ART 67.0* 04/18/2015 0335   HCO3 22.6 04/18/2015 0335   TCO2 21 04/18/2015 1617   ACIDBASEDEF 1.0 04/18/2015 0335   O2SAT 95.0 04/18/2015 0335    CXR: PORTABLE CHEST - 1 VIEW  COMPARISON: 04/20/2015  FINDINGS: There is a feeding tube with tip in the stomach. The heart size is moderately enlarged. Previous median sternotomy. Left pleural effusion and decreased aeration the left base is unchanged.  IMPRESSION: 1. No change from previous exam. Persistent left base opacification.   Electronically Signed  By: Kerby Moors M.D.  On: 04/21/2015 08:04   CT HEAD WITHOUT CONTRAST  TECHNIQUE: Contiguous axial images were obtained from the base of the skull through the vertex without intravenous contrast.  COMPARISON: 04/20/2015.  FINDINGS: There is a large area of edema involving the right cerebral hemisphere in the distribution of the right middle cerebral artery. Findings consistent with subacute infarct. There is associated mass effect and right to left midline shift measuring 9 mm, image 22 of series 2. No evidence for acute intracranial  hemorrhage. No new areas of stroke identified. The ventricular volumes are within normal limits. Mucosal thickening involving the left maxillary sinus is identified. The mastoid air cells are clear. The calvarium is intact.  IMPRESSION: 1. Similar appearance of subacute infarct involving much of the right cerebral hemisphere in the distribution of the right middle cerebral artery. 2. Stable edema and mass-effect with right to left midline shift measuring 9 mm.   Electronically Signed  By: Kerby Moors M.D.  On: 04/21/2015 07:30   Assessment/Plan: S/P Procedure(s) (LRB): BENTALL PROCEDURE (N/A) TRANSESOPHAGEAL ECHOCARDIOGRAM (TEE) (N/A) AORTIC VALVE REPLACEMENT (AVR) (N/A)  Stable hemodynamics  now POD4 maintaining AAI paced rhythm w/ stable BP Massive non-hemorrhagic right MCA territory stroke w/ associated cerebral edema and midline shift that has increased somewhat over past 48 hours - now on hypertonic saline w/ sodium levels maintaining 149-150 - Question potential benefit of intubation for hyperventilation? Poor cough, difficulty managing airway secretions - high risk for the development of pneumonia, aspiration, respiratory failure Expected post op acute blood loss anemia, stable Post op thrombocytopenia, resolved Lovenox added for DVT prophylaxis Tolerating full support tube feeds  I had another long conversation with the patient's daughter and family at the bedside.  Although the decision to initiate NO CODE BLUE status makes sense under the circumstances, I do think it would be reasonable to consider semi-elective intubation for mechanical ventilatory support to allow for hyperventilation as a means to diminish the effects of cerebral edema.  Moreover, the patient appears to be at very high risk for the development of pneumonia and respiratory failure, even if the patient begins to show signs of improvement from a neurologic standpoint in the short term.  If there remains a chance that he could recover from his stroke to potentially enjoy a reasonable quality of life, I think it would be reasonable to consider a relatively short-term period of mechanical ventilatory support as a means to help him recover from his acute event.  Await input from Dr. Leonie Man and the Stroke Team - will defer to their expertise.   Levi Shepard 04/21/2015 8:50 AM

## 2015-04-21 NOTE — Procedures (Signed)
Intubation Procedure Note Levi Shepard [id number] 1941/06/11  Procedure: Intubation Indications: Airway protection and maintenance  Procedure Details Consent: Risks of procedure as well as the alternatives and risks of each were explained to the (patient/caregiver).  Consent for procedure obtained. Time Out: Verified patient identification, verified procedure, site/side was marked, verified correct patient position, special equipment/implants available, medications/allergies/relevent history reviewed, required imaging and test results available.  Performed  Maximum sterile technique was used including gloves and hand hygiene.  MAC and 3    Evaluation Hemodynamic Status: BP stable throughout; O2 sats: stable throughout Patient's Current Condition: stable Complications: No apparent complications Patient did tolerate procedure well. Chest X-ray ordered to verify placement.  CXR: pending.   Levi Shepard 04/21/2015

## 2015-04-21 NOTE — Progress Notes (Signed)
Na level this am is 149, called Neuro on-call O. Camilo. Orders received to increase 3% Saline to 173ml/hr.

## 2015-04-21 NOTE — Progress Notes (Signed)
ANTIBIOTIC CONSULT NOTE - INITIAL  Pharmacy Consult for vancomycin and zosyn Indication: aspiration pneumonia  Allergies  Allergen Reactions  . Demerol Nausea And Vomiting    NAUSEA AND VOMITTING  . Morphine Hives and Rash    Patient Measurements: Height: 6' (182.9 cm) Weight: 191 lb 12.8 oz (87 kg) IBW/kg (Calculated) : 77.6 Adjusted Body Weight:   Vital Signs: Temp: 98 F (36.7 C) (07/17 1159) Temp Source: Axillary (07/17 1159) BP: 168/89 mmHg (07/17 1100) Pulse Rate: 80 (07/17 1158) Intake/Output from previous day: 07/16 0701 - 07/17 0700 In: 4075 [I.V.:1965; NG/GT:2060; IV Piggyback:50] Out: 4656 [Urine:3860] Intake/Output from this shift: Total I/O In: 790 [I.V.:500; NG/GT:290] Out: 150 [Urine:150]  Labs:  Recent Labs  04/19/15 0350 04/20/15 0400 04/21/15 0412  WBC 11.9* 10.4 10.3  HGB 11.1* 11.0* 10.9*  PLT 74* 94* 141*  CREATININE 0.92 0.93 0.77   Estimated Creatinine Clearance: 91.6 mL/min (by C-G formula based on Cr of 0.77). No results for input(s): VANCOTROUGH, VANCOPEAK, VANCORANDOM, GENTTROUGH, GENTPEAK, GENTRANDOM, TOBRATROUGH, TOBRAPEAK, TOBRARND, AMIKACINPEAK, AMIKACINTROU, AMIKACIN in the last 72 hours.   Microbiology: Recent Results (from the past 720 hour(s))  Surgical pcr screen     Status: Abnormal   Collection Time: 04/11/15  1:25 PM  Result Value Ref Range Status   MRSA, PCR NEGATIVE NEGATIVE Final   Staphylococcus aureus POSITIVE (A) NEGATIVE Final    Comment:        The Xpert SA Assay (FDA approved for NASAL specimens in patients over 101 years of age), is one component of a comprehensive surveillance program.  Test performance has been validated by [facility] for patients greater than or equal to 49 year old. It is not intended to diagnose infection nor to guide or monitor treatment.     Medical History: Past Medical History  Diagnosis Date  . Acute sinusitis, unspecified   . Ascending aortic aneurysm     4.8cm x  4.6cm in 2013  . Personal history of colonic polyps     x1-has surveillance colonoscopies.Complete Colonoscopy;with polypectomy [phone]; has surveillance colonoscopies.   . Aortic valve insufficiency   . Benign neoplasm of colon   . 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 Chest pain, unspecified   . Other dyspnea and respiratory abnormality   . Hypertension   . Hyperlipidemia   . Other testicular hypofunction   . Impotence of organic origin   . Nonspecific elevation of levels of transaminase or lactic acid dehydrogenase (LDH)   . Disorders of bursae and tendons in shoulder region, unspecified     Right  . Special screening for malignant neoplasm of prostate   . Complication of anesthesia   . PONV (postoperative nausea and vomiting)   . Heart murmur   . Unspecified asthma(493.90)     h/o, 2-3 yrs. since using albuterol   . History of hiatal hernia   . Arthritis     back & R shoulder  . Cancer     facial- basal cell   Assessment: 74 yo male with ascending aortic aneurysm and AI had Bentall procedure 7/13. Developed slurred speech and Lt sided weakness post-op from CVA. Developed aspiration with respiratory failure. New orders to start vancomycin and zosyn for aspiration pneumonia.   Tmax of 99.1 overnight, wbc stable at 10, scr normal. Patient being pancultured.  Goal of Therapy:  Vancomycin trough level 15-20 mcg/ml  Plan:  Measure antibiotic drug levels at steady state Follow up  culture results Zosyn 3.375g IV q8 hours  Vancomycin 1g q8 hours - check trough 7/18 given age  Erin Hearing PharmD., BCPS Clinical Pharmacist Pager [phone] 04/21/2015 12:08 PM

## 2015-04-21 NOTE — Progress Notes (Signed)
PT Cancellation Note  Patient Details Name: Levi Shepard MRN: [id number] DOB: 21-Sep-1941   Cancelled Treatment:    Reason Eval/Treat Not Completed: Patient not medically ready. Pt with significant decline in med status since initial PT order. Pt is now intubated. PT signing off. Please re-order, if needed. Thank you.   Lorriane Shire 04/21/2015, 2:05 PM

## 2015-04-21 NOTE — Consult Note (Signed)
PULMONARY / CRITICAL CARE MEDICINE   Name: Levi Shepard MRN: [id number] DOB: 12/16/1940    ADMISSION DATE:  04/25/2015 CONSULTATION DATE:  04/21/2015  REFERRING MD :  Ricard Dillon  CHIEF COMPLAINT:  Short of breath  INITIAL PRESENTATION:  74 yo male with ascending aortic aneurysm and AI had Bentall procedure 7/13.  Developed slurred speech and Lt sided weakness post-op from CVA.  Developed aspiration with respiratory failure and PCCM consulted.  STUDIES:  7/17 CT head >> infarct Rt cerebral hemisphere, edema, 9 mm Rt to Lt shift  SIGNIFICANT EVENTS: 7/13 Bentall procedure 7/14 CVA, neuro consulted 7/16 Started 3% NS  7/17 Goals of care changed to limited resuscitation >> okay with short term intubation   HISTORY OF PRESENT ILLNESS:   74 yo with aortic aneurysm.  Had Bentall procedure.  Developed CVA post op.  Initially was DNR.  Had cerebral edema and started 3% NS.  Had progressive respiratory distress and aspiration.  Family changed code status to limited resuscitation >> okay with short term intubation.  At time of intubation he had thick, yellow secretions from his airway.  PAST MEDICAL HISTORY :   has a past medical history of Acute sinusitis, unspecified; Ascending aortic aneurysm; Personal history of colonic polyps; Aortic valve insufficiency; Benign neoplasm of colon; Hypertrophy of prostate without urinary obstruction and other lower urinary tract symptoms (LUTS); Chest pain, unspecified; Other dyspnea and respiratory abnormality; Hypertension; Hyperlipidemia; Other testicular hypofunction; Impotence of organic origin; Nonspecific elevation of levels of transaminase or lactic acid dehydrogenase (LDH); Disorders of bursae and tendons in shoulder region, unspecified; Special screening for malignant neoplasm of prostate; Complication of anesthesia; PONV (postoperative nausea and vomiting); Heart murmur; Unspecified asthma(493.90); History of hiatal hernia; Arthritis; and Cancer.  has past  surgical history that includes ORIF radial shaft fracture; Cardiac catheterization (N/A, 04/05/2015); Eye surgery (Bilateral); Bentall procedure (N/A, 04/27/2015); TEE without cardioversion (N/A, 04/20/2015); and Aortic valve replacement (N/A, 04/23/2015). Prior to Admission medications   Medication Sig Start Date End Date Taking? Authorizing Provider  aspirin EC 81 MG tablet Take 1 tablet (81 mg total) by mouth daily. 03/28/15  Yes Dorothy Spark, MD  Cholecalciferol (VITAMIN D PO) Take 400 Units by mouth daily.   Yes Historical Provider, MD  cyclobenzaprine (FLEXERIL) 10 MG tablet Take 5-10 mg by mouth at bedtime as needed for muscle spasms.   Yes Historical Provider, MD  ERTACZO 2 % CREA Apply 1 application topically daily as needed (rash).  10/21/11  Yes Historical Provider, MD  fenofibrate micronized (LOFIBRA) 67 MG capsule Take 67 mg by mouth daily.   Yes Historical Provider, MD  lovastatin (MEVACOR) 20 MG tablet Take 20 mg by mouth daily. 03/27/15  Yes Historical Provider, MD  meloxicam (MOBIC) 15 MG tablet Take 15 mg by mouth daily. 03/22/15  Yes Historical Provider, MD  methocarbamol (ROBAXIN) 500 MG tablet Take 500 mg by mouth every 8 (eight) hours as needed.  03/22/15  Yes Historical Provider, MD  Multiple Vitamin (MULTIVITAMIN) capsule Take 1 capsule by mouth daily.   Yes Historical Provider, MD  Tamsulosin HCl (FLOMAX) 0.4 MG CAPS Take 0.4 mg by mouth at bedtime.   Yes Historical Provider, MD  Triamcinolone Acetonide (NASACORT ALLERGY 24HR NA) Place 1 spray into both nostrils daily as needed (allergies).   Yes Historical Provider, MD   Allergies  Allergen Reactions  . Demerol Nausea And Vomiting    NAUSEA AND VOMITTING  . Morphine Hives and Rash    FAMILY HISTORY:  indicated  that his mother is deceased. He indicated that his father is deceased.  SOCIAL HISTORY:  reports that he has never smoked. He does not have any smokeless tobacco history on file. He reports that he drinks alcohol.  He reports that he does not use illicit drugs.  REVIEW OF SYSTEMS:   Unable to obtain.  SUBJECTIVE:   VITAL SIGNS: Temp:  [97.2 F (36.2 C)-99.1 F (37.3 C)] 99 F (37.2 C) (07/17 0800) Pulse Rate:  [78-82] 80 (07/17 1158) Resp:  [17-27] 25 (07/17 1158) BP: (119-173)/(66-101) 168/89 mmHg (07/17 1100) SpO2:  [98 %-100 %] 98 % (07/17 1158) FiO2 (%):  [100 %] 100 % (07/17 1158) Weight:  [191 lb 12.8 oz (87 kg)] 191 lb 12.8 oz (87 kg) (07/17 0356) HEMODYNAMICS:   VENTILATOR SETTINGS: Vent Mode:  [-]  FiO2 (%):  [100 %] 100 % INTAKE / OUTPUT:  Intake/Output Summary (Last 24 hours) at 04/21/15 1159 Last data filed at 04/21/15 1100  Gross per 24 hour  Intake   4340 ml  Output   3535 ml  Net    805 ml    PHYSICAL EXAMINATION: General: ill appearing Neuro:  Weak on Lt side HEENT:  Gurgling respirations Cardiovascular:  regular Lungs:  B/l crackles Abdomen:  Soft, non tender Musculoskeletal:  No edema Skin:  No rashes  LABS:  CBC  Recent Labs Lab 04/19/15 0350 04/20/15 0400 04/21/15 0412  WBC 11.9* 10.4 10.3  HGB 11.1* 11.0* 10.9*  HCT 32.6* 32.1* 32.5*  PLT 74* 94* 141*   Coag's  Recent Labs Lab 04/08/2015 1530  APTT 32  INR 1.53*   BMET  Recent Labs Lab 04/19/15 0350 04/20/15 0400  04/20/15 2240 04/21/15 0412 04/21/15 0925  NA 139 140  < > 150* 149* 153*  K 3.5 3.0*  --   --  3.6  --   CL 108 109  --   --  121*  --   CO2 24 23  --   --  23  --   BUN 20 21*  --   --  21*  --   CREATININE 0.92 0.93  --   --  0.77  --   GLUCOSE 122* 125*  --   --  98  --   < > = values in this interval not displayed.   Electrolytes  Recent Labs Lab 05/04/2015 2123  04/18/15 0450 04/18/15 1615 04/19/15 0350 04/20/15 0400 04/21/15 0412  CALCIUM  --   < > 8.9  --  9.2 9.1 9.0  MG 2.6*  --  2.1 1.9  --   --   --   < > = values in this interval not displayed.   ABG  Recent Labs Lab 04/06/2015 1412 04/06/2015 1532 04/18/15 0335  PHART 7.274* 7.359 7.447   PCO2ART 48.5* 46.0* 32.5*  PO2ART 88.0 69.0* 67.0*   Liver Enzymes No results for input(s): AST, ALT, ALKPHOS, BILITOT, ALBUMIN in the last 168 hours.  Glucose  Recent Labs Lab 04/20/15 1153 04/20/15 1636 04/20/15 2005 04/21/15 0013 04/21/15 0400 04/21/15 0803  GLUCAP 108* 108* 137* 141* 93 122*    Imaging Ct Head Wo Contrast  04/21/2015   CLINICAL DATA:  Cytotoxic brain edema follow-up.  EXAM: CT HEAD WITHOUT CONTRAST  TECHNIQUE: Contiguous axial images were obtained from the base of the skull through the vertex without intravenous contrast.  COMPARISON:  04/20/2015.  FINDINGS: There is a large area of edema involving the right cerebral hemisphere in the distribution of  the right middle cerebral artery. Findings consistent with subacute infarct. There is associated mass effect and right to left midline shift measuring 9 mm, image 22 of series 2. No evidence for acute intracranial hemorrhage. No new areas of stroke identified. The ventricular volumes are within normal limits. Mucosal thickening involving the left maxillary sinus is identified. The mastoid air cells are clear. The calvarium is intact.  IMPRESSION: 1. Similar appearance of subacute infarct involving much of the right cerebral hemisphere in the distribution of the right middle cerebral artery. 2. Stable edema and mass-effect with right to left midline shift measuring 9 mm.   Electronically Signed   By: Kerby Moors M.D.   On: 04/21/2015 07:30   Dg Chest Port 1 View  04/21/2015   CLINICAL DATA:  Atelectasis and shortness of Breath  EXAM: PORTABLE CHEST - 1 VIEW  COMPARISON:  04/20/2015  FINDINGS: There is a feeding tube with tip in the stomach. The heart size is moderately enlarged. Previous median sternotomy. Left pleural effusion and decreased aeration the left base is unchanged.  IMPRESSION: 1. No change from previous exam. Persistent left base opacification.   Electronically Signed   By: Kerby Moors M.D.   On:  04/21/2015 08:04     ASSESSMENT / PLAN:  PULMONARY ETT 7/17 >> A: Compromised airway in setting of CVA and aspiration pneumonia. P:   Full vent support F/u CXR, ABG  CARDIOVASCULAR Rt IJ Introducer 7/13 >> A:  Aortic aneurysm s/p Bentall procedure. Hx of HTN, HLD. P:  Per TCTS  RENAL A:   Medically induced hypernatremia. P:   Goal Na 150 to 155 per neurology  GASTROINTESTINAL A:   Nutrition. P:   Tube feeds Protonix for SUP  HEMATOLOGIC A:   Anemia of critical illness. Mild thrombocytopenia. P:  F/u CBC Lovenox for DVT prevention  INFECTIOUS A:   Aspiration pneumonia. P:   Day 1 vancomycin, zosyn  Sputum 7/17 >>  ENDOCRINE A:   Hyperglycemia. P:   SSI  NEUROLOGIC A:   Acute CVA with cerebral edema. P:   RASS goal -1 Neurology following  CC time 40 minutes.  Chesley Mires, MD [facility] Pulmonary/Critical Care 04/21/2015, 12:11 PM Pager:  [phone] After 3pm call: [phone]

## 2015-04-22 ENCOUNTER — Inpatient Hospital Stay (HOSPITAL_COMMUNITY): Payer: Medicare Other

## 2015-04-22 DIAGNOSIS — I712 Thoracic aortic aneurysm, without rupture: Principal | ICD-10-CM

## 2015-04-22 DIAGNOSIS — J690 Pneumonitis due to inhalation of food and vomit: Secondary | ICD-10-CM

## 2015-04-22 DIAGNOSIS — J9601 Acute respiratory failure with hypoxia: Secondary | ICD-10-CM

## 2015-04-22 LAB — GLUCOSE, CAPILLARY
GLUCOSE-CAPILLARY: 110 mg/dL — AB (ref 65–99)
GLUCOSE-CAPILLARY: 118 mg/dL — AB (ref 65–99)
Glucose-Capillary: 104 mg/dL — ABNORMAL HIGH (ref 65–99)
Glucose-Capillary: 108 mg/dL — ABNORMAL HIGH (ref 65–99)
Glucose-Capillary: 133 mg/dL — ABNORMAL HIGH (ref 65–99)
Glucose-Capillary: 135 mg/dL — ABNORMAL HIGH (ref 65–99)

## 2015-04-22 LAB — CBC
HCT: 31.1 % — ABNORMAL LOW (ref 39.0–52.0)
HCT: 28.8 % — ABNORMAL LOW (ref 39.0–52.0)
HEMOGLOBIN: 10.1 g/dL — AB (ref 13.0–17.0)
Hemoglobin: 9.4 g/dL — ABNORMAL LOW (ref 13.0–17.0)
MCH: 30.9 pg (ref 26.0–34.0)
MCH: 31 pg (ref 26.0–34.0)
MCHC: 32.5 g/dL (ref 30.0–36.0)
MCHC: 32.6 g/dL (ref 30.0–36.0)
MCV: 95.1 fL (ref 78.0–100.0)
MCV: 95 fL (ref 78.0–100.0)
PLATELETS: 164 10*3/uL (ref 150–400)
Platelets: 195 10*3/uL (ref 150–400)
RBC: 3.27 MIL/uL — AB (ref 4.22–5.81)
RBC: 3.03 MIL/uL — AB (ref 4.22–5.81)
RDW: 14.2 % (ref 11.5–15.5)
RDW: 14.4 % (ref 11.5–15.5)
WBC: 8.3 10*3/uL (ref 4.0–10.5)
WBC: 8.2 10*3/uL (ref 4.0–10.5)

## 2015-04-22 LAB — COMPREHENSIVE METABOLIC PANEL
ALK PHOS: 31 U/L — AB (ref 38–126)
ALT: 33 U/L (ref 17–63)
AST: 50 U/L — ABNORMAL HIGH (ref 15–41)
Albumin: 2.4 g/dL — ABNORMAL LOW (ref 3.5–5.0)
Anion gap: 4 — ABNORMAL LOW (ref 5–15)
BUN: 23 mg/dL — AB (ref 6–20)
CO2: 23 mmol/L (ref 22–32)
CREATININE: 0.96 mg/dL (ref 0.61–1.24)
Calcium: 9.4 mg/dL (ref 8.9–10.3)
Chloride: 129 mmol/L — ABNORMAL HIGH (ref 101–111)
GFR calc non Af Amer: >60 mL/min (ref >60)
GLUCOSE: 119 mg/dL — AB (ref 65–99)
POTASSIUM: 3.1 mmol/L — AB (ref 3.5–5.1)
Sodium: 156 mmol/L — ABNORMAL HIGH (ref 135–145)
TOTAL PROTEIN: 5.5 g/dL — AB (ref 6.5–8.1)
Total Bilirubin: 0.8 mg/dL (ref 0.3–1.2)

## 2015-04-22 LAB — BASIC METABOLIC PANEL
Anion gap: 7 (ref 5–15)
BUN: 25 mg/dL — AB (ref 6–20)
CO2: 24 mmol/L (ref 22–32)
CREATININE: 1.03 mg/dL (ref 0.61–1.24)
Calcium: 9 mg/dL (ref 8.9–10.3)
Chloride: 127 mmol/L — ABNORMAL HIGH (ref 101–111)
GFR calc Af Amer: >60 mL/min (ref >60)
Glucose, Bld: 104 mg/dL — ABNORMAL HIGH (ref 65–99)
Potassium: 3.6 mmol/L (ref 3.5–5.1)
SODIUM: 158 mmol/L — AB (ref 135–145)

## 2015-04-22 LAB — SODIUM
SODIUM: 154 mmol/L — AB (ref 135–145)
Sodium: 158 mmol/L — ABNORMAL HIGH (ref 135–145)

## 2015-04-22 LAB — PREALBUMIN: Prealbumin: 8.3 mg/dL — ABNORMAL LOW (ref 18–38)

## 2015-04-22 MED ORDER — POTASSIUM CHLORIDE 20 MEQ/15ML (10%) PO SOLN
20.0000 meq | ORAL | Status: AC
  Administered 2015-04-22 (×3): 20 meq via ORAL
  Filled 2015-04-22 (×3): qty 15

## 2015-04-22 MED ORDER — VITAL HIGH PROTEIN PO LIQD: 1000.0000 mL | ORAL | Status: DC

## 2015-04-22 MED ORDER — POTASSIUM CHLORIDE 20 MEQ/15ML (10%) PO SOLN
20.0000 meq | ORAL | Status: AC
  Administered 2015-04-22 – 2015-04-23 (×3): 20 meq via ORAL
  Filled 2015-04-22 (×3): qty 15

## 2015-04-22 NOTE — Progress Notes (Signed)
Occupational Therapy Discharge Patient Details Name: Levi Shepard MRN: [id number] DOB: Dec 03, 1940 Today's Date: 04/22/2015 Time:  -     Patient discharged from OT services secondary to medical decline - will need to re-order OT to resume therapy services.  Please see latest therapy progress note for current level of functioning and progress toward goals.    Progress and discharge plan discussed with patient and/or caregiver: Pt is now intubated.  OT will sign off due to medical decline - evaluation not performed.   Please re-order if/when pt becomes medically appropriate.   Robert Lee, [place], OTR/L [phone]  04/22/2015, 10:46 AM

## 2015-04-22 NOTE — Plan of Care (Signed)
Problem: Phase II - Intermediate Post-Op Goal: Advance Diet Outcome: Not Progressing Pt intubated and on tube feedings. Goal: Patient advanced to Phase III: Barriers addressed Outcome: Not Progressing Pt intubated and sedated still.

## 2015-04-22 NOTE — Progress Notes (Signed)
PULMONARY / CRITICAL CARE MEDICINE   Name: Levi Shepard MRN: [id number] DOB: 10/31/1940    ADMISSION DATE:  04/23/2015 CONSULTATION DATE:  04/21/2015  REFERRING MD :  Ricard Dillon  CHIEF COMPLAINT:  Short of breath  INITIAL PRESENTATION:  74 yo male with ascending aortic aneurysm and AI had Bentall procedure 7/13.  Developed slurred speech and Lt sided weakness post-op from CVA.  Developed aspiration with respiratory failure and PCCM consulted.  STUDIES:  7/17 CT head >> massive infarct Rt cerebral hemisphere, edema, 9 mm Rt to Lt shift  SIGNIFICANT EVENTS: 7/13 Bentall procedure 7/14 CVA, neuro consulted 7/16 Started 3% NS  7/17 Goals of care changed to limited resuscitation >> okay with short term intubation  SUBJECTIVE: afebrile Intubated,s edated Good UO 3% on hold  VITAL SIGNS: Temp:  [98 F (36.7 C)-99.5 F (37.5 C)] 99.1 F (37.3 C) (07/18 0804) Pulse Rate:  [79-90] 90 (07/18 0844) Resp:  [16-28] 16 (07/18 0844) BP: (106-168)/(58-96) 136/78 mmHg (07/18 0844) SpO2:  [90 %-100 %] 100 % (07/18 0844) FiO2 (%):  [40 %-100 %] 40 % (07/18 0845) Weight:  [190 lb 0.6 oz (86.2 kg)] 190 lb 0.6 oz (86.2 kg) (07/18 0336) HEMODYNAMICS:   VENTILATOR SETTINGS: Vent Mode:  [-] CPAP;PSV FiO2 (%):  [40 %-100 %] 40 % Set Rate:  [22 bmp] 22 bmp Vt Set:  [620 mL] 620 mL PEEP:  [5 cmH20] 5 cmH20 Pressure Support:  [10 cmH20] 10 cmH20 Plateau Pressure:  [11 cmH20-20 cmH20] 20 cmH20 INTAKE / OUTPUT:  Intake/Output Summary (Last 24 hours) at 04/22/15 0855 Last data filed at 04/22/15 0800  Gross per 24 hour  Intake 4319.14 ml  Output   3710 ml  Net 609.14 ml    PHYSICAL EXAMINATION: General:acutely  ill appearing Neuro:  Weak on Lt side, sedated on propofol RASS-2 HEENT:  No jvd Cardiovascular:  regular Lungs:  B/l crackles Abdomen:  Soft, non tender Musculoskeletal:  No edema Skin:  No rashes  LABS:  CBC  Recent Labs Lab 04/20/15 0400 04/21/15 0412 04/22/15 0410   WBC 10.4 10.3 8.3  HGB 11.0* 10.9* 10.1*  HCT 32.1* 32.5* 31.1*  PLT 94* 141* 164   Coag's  Recent Labs Lab 04/19/2015 1530  APTT 32  INR 1.53*   BMET  Recent Labs Lab 04/20/15 0400  04/21/15 0412  04/21/15 1630 04/21/15 2017 04/22/15 0410  NA 140  < > 149*  < > 159* 157* 156*  K 3.0*  --  3.6  --   --   --  3.1*  CL 109  --  121*  --   --   --  129*  CO2 23  --  23  --   --   --  23  BUN 21*  --  21*  --   --   --  23*  CREATININE 0.93  --  0.77  --   --   --  0.96  GLUCOSE 125*  --  98  --   --   --  119*  < > = values in this interval not displayed.   Electrolytes  Recent Labs Lab 04/29/2015 2123 04/18/15 0450 04/18/15 1615  04/20/15 0400 04/21/15 0412 04/22/15 0410  CALCIUM  --  8.9  --   < > 9.1 9.0 9.4  MG 2.6* 2.1 1.9  --   --   --   --   < > = values in this interval not displayed.   ABG  Recent  Labs Lab 05/02/2015 1532 04/18/15 0335 04/21/15 1236  PHART 7.359 7.447 7.453*  PCO2ART 46.0* 32.5* 30.9*  PO2ART 69.0* 67.0* 209.0*   Liver Enzymes  Recent Labs Lab 04/22/15 0410  AST 50*  ALT 33  ALKPHOS 31*  BILITOT 0.8  ALBUMIN 2.4*    Glucose  Recent Labs Lab 04/21/15 1157 04/21/15 1631 04/21/15 1940 04/21/15 2348 04/22/15 0401 04/22/15 0802  GLUCAP 98 120* 112* 133* 104* 135*    Imaging Dg Chest Port 1 View  04/22/2015   CLINICAL DATA:  Hypoxia  EXAM: PORTABLE CHEST - 1 VIEW  COMPARISON:  April 21, 2015  FINDINGS: Endotracheal tube tip is 3.3 cm above the carina. Feeding tube tip is below the diaphragm. No pneumothorax. Airspace consolidation in the left base remains with opacity obscuring visualization of the left hemidiaphragm. Lungs elsewhere clear. Heart is mildly enlarged with pulmonary vascularity within normal limits. No adenopathy.  IMPRESSION: Tube positions as described without pneumothorax. Persistent opacity felt to be consistent with consolidation left base. No change in cardiac silhouette. No new opacity.    Electronically Signed   By: Lowella Grip III M.D.   On: 04/22/2015 07:24   Dg Chest Port 1 View  04/21/2015   CLINICAL DATA:  Confirm ETT placement  EXAM: PORTABLE CHEST - 1 VIEW  COMPARISON:  04/21/2015 at 0531 hours  FINDINGS: Endotracheal tube terminates 4.5 cm above the carina.  Cardiomegaly with pulmonary vascular congestion. No frank interstitial edema. Left lung base is obscured.  Enteric tube terminates in the stomach.  IMPRESSION: Endotracheal tube terminates 4.5 cm above the carina.   Electronically Signed   By: Julian Hy M.D.   On: 04/21/2015 12:43     ASSESSMENT / PLAN:  PULMONARY ETT 7/17 >> A: Compromised airway in setting of CVA and aspiration pneumonia. P:   SBTs ok but no extubation No benefit of hyperventilation in this setting  CARDIOVASCULAR Rt IJ Introducer 7/13 >> A:  Aortic aneurysm s/p Bentall procedure. Hx of HTN, HLD. P:  Per TCTS BP control  RENAL A:   Medically induced hypernatremia. P:   Goal Na 150 to 155 per neurology Follow Na every 4-6 h  GASTROINTESTINAL A:   Nutrition. P:   Tube feeds Protonix for SUP  HEMATOLOGIC A:   Anemia of critical illness. Mild thrombocytopenia. P:  F/u CBC Lovenox for DVT prevention  INFECTIOUS A:   Aspiration pneumonia. P:   7/17 >> vancomycin, zosyn  Sputum 7/17 >>  ENDOCRINE A:   Hyperglycemia. P:   SSI  NEUROLOGIC A:   Acute CVA with cerebral edema & shift P:   RASS goal 0 Neurology following 3% saline per protocol  Summary - Massive Rt CVA post Bentall with intubation for  Aspiration. Guarded prognosis  The patient is critically ill with multiple organ systems failure and requires high complexity decision making for assessment and support, frequent evaluation and titration of therapies, application of advanced monitoring technologies and extensive interpretation of multiple databases. Critical Care Time devoted to patient care services described in this note  independent of APP time is 31 minutes.    Kara Mead MD. [facility]. [place] [facility] Pager [phone] If no response call [phone]    04/22/2015, 8:55 AM

## 2015-04-22 NOTE — Plan of Care (Signed)
Problem: Acute Treatment Outcomes Goal: Neuro exam at baseline or improved Outcome: Not Met (add Reason) UTA due to sedation and vent. Goal: Other Acute Treatment Outcomes Outcome: Not Progressing Pt unchanged from yesterday

## 2015-04-22 NOTE — Care Management Important Message (Signed)
Important Message  Patient Details  Name: Levi Shepard MRN: [id number] Date of Birth: Oct 13, 1940   Medicare Important Message Given:  Yes-third notification given    Pricilla Handler 04/22/2015, 2:35 PM

## 2015-04-22 NOTE — Progress Notes (Signed)
SLP Cancellation Note  Patient Details Name: Levi Shepard MRN: [id number] DOB: Sep 13, 1941   Cancelled treatment:       Reason Eval/Treat Not Completed: Medical issues which prohibited therapy. Intubated and sedated. Will sign off for now   Lynann Beaver 04/22/2015, 8:16 AM

## 2015-04-22 NOTE — Progress Notes (Signed)
Patient ID: Levi Shepard, male   DOB: 1940-10-10, 74 y.o.   MRN: [id number] EVENING ROUNDS NOTE :     [address]       [place] [address]             [phone]                 5 Days Post-Op Procedure(s) (LRB): BENTALL PROCEDURE (N/A) TRANSESOPHAGEAL ECHOCARDIOGRAM (TEE) (N/A) AORTIC VALVE REPLACEMENT (AVR) (N/A)  Total Length of Stay:  LOS: 5 days  BP 112/69 mmHg  Pulse 89  Temp(Src) 99.4 F (37.4 C) (Axillary)  Resp 22  Ht 6' (1.829 m)  Wt 190 lb 0.6 oz (86.2 kg)  BMI 25.77 kg/m2  SpO2 100%  .Intake/Output      07/17 0701 - 07/18 0700 07/18 0701 - 07/19 0700   I.V. (mL/kg) 1940.5 (22.5) 331.2 (3.8)   NG/GT 1710 735   IV Piggyback 750 250   Total Intake(mL/kg) 4400.5 (51.1) 1316.2 (15.3)   Urine (mL/kg/hr) 3735 (1.8) 1275 (1.4)   Stool     Total Output 3735 1275   Net +665.5 +41.2          . sodium chloride 20 mL (04/21/2015 1545)  . sodium chloride    . sodium chloride 10 mL/hr at 04/22/15 1132  . feeding supplement (VITAL AF 1.2 CAL) 1,000 mL (04/22/15 1700)  . propofol (DIPRIVAN) infusion 15 mcg/kg/min (04/22/15 1700)  . sodium chloride (hypertonic) Stopped (04/21/15 1745)     Lab Results  Component Value Date   WBC 8.3 04/22/2015   HGB 10.1* 04/22/2015   HCT 31.1* 04/22/2015   PLT 164 04/22/2015   GLUCOSE 119* 04/22/2015   CHOL 90 04/20/2015   TRIG 93 04/21/2015   HDL 22* 04/20/2015   LDLCALC 46 04/20/2015   ALT 33 04/22/2015   AST 50* 04/22/2015   NA 158* 04/22/2015   K 3.1* 04/22/2015   CL 129* 04/22/2015   CREATININE 0.96 04/22/2015   BUN 23* 04/22/2015   CO2 23 04/22/2015   INR 1.53* 04/12/2015   HGBA1C 5.8* 04/11/2015   Has remained sedated on vent today, hemodynamically stable Na 158, repeat just sent and is pending 1100 uop since 7 am today  Grace Isaac MD  Beeper [phone] Office [phone] 04/22/2015 5:33 PM

## 2015-04-22 NOTE — Plan of Care (Signed)
Problem: Problem: Respiratory Progression Goal: ABLE TO EXTUBATE Outcome: Not Progressing Pt intubated during last shift to protect airway. Goal: ABLE TO WEAN TO ROOM AIR Outcome: Progressing Able to turn down FiO2 on ventilator to 50% O2.

## 2015-04-22 NOTE — Progress Notes (Signed)
STROKE TEAM PROGRESS NOTE   HISTORY Levi Shepard is a 74 y.o. 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 Upon waking this AM patient was noted to have a right gaze preference, inability to squeeze left hand, left facial droop and weakness of left leg. 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   Date last known well: Date: 04/18/2015 Time last known well: Unable to determine tPA Given: No: post op AO root replacement, unknown LKW Modified Rankin: Rankin Score=0   SUBJECTIVE (INTERVAL HISTORY) No family is at the bedside. The patient was intubated on ventilation with sedation. Able to open eyes on pain stimulation but not following commands, moving RUE spontaneously. Serum sodium is 159 and hypertonic saline is on hold.   OBJECTIVE Temp:  [98.5 F (36.9 C)-99.5 F (37.5 C)] 99.4 F (37.4 C) (07/18 1559) Pulse Rate:  [88-90] 89 (07/18 1700) Cardiac Rhythm:  [-] Atrial paced (07/18 0800) Resp:  [13-28] 22 (07/18 1700) BP: (106-165)/(58-98) 112/69 mmHg (07/18 1700) SpO2:  [99 %-100 %] 100 % (07/18 1700) FiO2 (%):  [40 %-50 %] 40 % (07/18 1537) Weight:  [190 lb 0.6 oz (86.2 kg)] 190 lb 0.6 oz (86.2 kg) (07/18 0336)   Recent Labs Lab 04/21/15 2348 04/22/15 0401 04/22/15 0802 04/22/15 1220 04/22/15 1554  GLUCAP 133* 104* 135* 108* 110*    Recent Labs Lab 04/12/2015 2123  04/18/15 0450 04/18/15 1615 04/18/15 1617 04/19/15 0350 04/20/15 0400  04/21/15 0412  04/21/15 1630 04/21/15 2017 04/22/15 0410 04/22/15 1050 04/22/15 1650  NA  --   < > 137  --  141 139 140  < > 149*  < > 159* 157* 156* 158* 154*  K  --   < > 3.9  --  3.8 3.5 3.0*  --  3.6  --   --   --  3.1*  --   --   CL  --   < > 107  --  104 108 109  --  121*  --   --   --  129*  --   --   CO2  --   --  22  --    --  24 23  --  23  --   --   --  23  --   --   GLUCOSE  --   < > 154*  --  139* 122* 125*  --  98  --   --   --  119*  --   --   BUN  --   < > 16  --  20 20 21*  --  21*  --   --   --  23*  --   --   CREATININE 1.34*  < > 1.23 1.03 0.90 0.92 0.93  --  0.77  --   --   --  0.96  --   --   CALCIUM  --   < > 8.9  --   --  9.2 9.1  --  9.0  --   --   --  9.4  --   --   MG 2.6*  --  2.1 1.9  --   --   --   --   --   --   --   --   --   --   --   < > =  values in this interval not displayed.  Recent Labs Lab 04/22/15 0410  AST 50*  ALT 33  ALKPHOS 31*  BILITOT 0.8  PROT 5.5*  ALBUMIN 2.4*    Recent Labs Lab 04/18/15 1615 04/18/15 1617 04/19/15 0350 04/20/15 0400 04/21/15 0412 04/22/15 0410  WBC 10.1  --  11.9* 10.4 10.3 8.3  HGB 11.1* 10.9* 11.1* 11.0* 10.9* 10.1*  HCT 32.7* 32.0* 32.6* 32.1* 32.5* 31.1*  MCV 92.1  --  92.1 93.0 94.2 95.1  PLT 72*  --  74* 94* 141* 164   No results for input(s): CKTOTAL, CKMB, CKMBINDEX, TROPONINI in the last 168 hours. No results for input(s): LABPROT, INR in the last 72 hours. No results for input(s): COLORURINE, LABSPEC, [place], GLUCOSEU, HGBUR, BILIRUBINUR, KETONESUR, PROTEINUR, UROBILINOGEN, NITRITE, LEUKOCYTESUR in the last 72 hours.  Invalid input(s): APPERANCEUR     Component Value Date/Time   CHOL 90 04/20/2015 0400   TRIG 93 04/21/2015 1245   HDL 22* 04/20/2015 0400   CHOLHDL 4.1 04/20/2015 0400   VLDL 22 04/20/2015 0400   LDLCALC 46 04/20/2015 0400   Lab Results  Component Value Date   HGBA1C 5.8* 04/11/2015   No results found for: LABOPIA, COCAINSCRNUR, LABBENZ, AMPHETMU, THCU, LABBARB  No results for input(s): ETH in the last 168 hours.   Imaging I have personally reviewed the radiological images below and agree with the radiology interpretations.  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     04/21/15 1. Similar appearance of subacute  infarct involving much of the right cerebral hemisphere in the distribution of the right middle cerebral artery. 2. Stable edema and mass-effect with right to left midline shift measuring 9 mm.  CT angio brain 04/20/15 : 1. Occlusion of the distal right MCA M1 segment with moderate to poor right MCA territory collateral flow. 2. Evolved complete right MCA territory infarct with increased cytotoxic edema and mass effect. Leftward midline shift now 8 mm. No associated hemorrhage.  TTE 04/01/15 - - Left ventricle: The cavity size was normal. There was moderate concentric hypertrophy. Systolic function was normal. The estimated ejection fraction was in the range of 50% to 55%. Wall motion was normal; there were no regional wall motion abnormalities. Due to first degree atrioventricular block, there was fusion of Shepard and atrial contributions to ventricular filling. - Ventricular septum: Septal motion showed paradox. - Aortic valve: Valve area (VTI): 3.56 cm^2. Valve area (Vmean): 3.33 cm^2. - Aortic root: The aortic root was moderately dilated. - Ascending aorta: The ascending aorta was severely dilated. - Mitral valve: There was mild to moderate regurgitation directed centrally. - Left atrium: The atrium was mildly dilated.  CUS - Bilateral: 1-39% ICA stenosis. Vertebral artery flow is antegrade.  PHYSICAL EXAM   Temp:  [98.5 F (36.9 C)-99.5 F (37.5 C)] 99.4 F (37.4 C) (07/18 1559) Pulse Rate:  [88-90] 89 (07/18 1800) Resp:  [13-28] 22 (07/18 1800) BP: (106-165)/(58-98) 121/76 mmHg (07/18 1800) SpO2:  [99 %-100 %] 100 % (07/18 1800) FiO2 (%):  [40 %-50 %] 40 % (07/18 1537) Weight:  [190 lb 0.6 oz (86.2 kg)] 190 lb 0.6 oz (86.2 kg) (07/18 0336)  General - Well nourished, well developed, intubated on sedation.  Ophthalmologic - Fundi not visualized due to small pupils.  Cardiovascular - Regular rate and rhythm with no murmur.  Neuro - intubated on sedation,  on voice stimulation, he briefly open eyes, eyes  deviated to the right, left neglect, not blinking to visual threat. PERRL, left facial droop, LUE 0/5, LLE 0/5 with pain stimulation 1/5. RUE spontaneous movement, RLE mild withdraw with pain stimulation. sensation, coordination and gait not tested.   ASSESSMENT/PLAN Levi Shepard is a 74 y.o. 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  He did not receive IV t-PA due to recent surgery and unknown time of onset.  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   CTA- Occluded distal right M1  Carotid Doppler unremarkable.   2D Echo - EF 50-55%. No cardiac source of emboli identified.  LDL 46  HgbA1c 5.8  lovenox for VTE prophylaxis Diet NPO time specified  aspirin 81 mg orally every day prior to admission, now on ASA 325mg  daily  Ongoing aggressive stroke risk factor management  Therapy recommendations: Pending  Disposition:  Pending  Cerebral edema  Intubated now  3% saline on hold now  Na 159  Goal Na 150-160  Na Q6h  Hypertension  Home meds:  No antihypertensives medications prior to admission  Stable  Hyperlipidemia  Home meds:  Fenofibrate and lovastatin  LDL 46, goal < 70  Continue home regimen at discharge  Other Stroke Risk Factors  Advanced age  ETOH use  Recent aortic root dissection and subsequent replacement  Other Active Problems  Mild anemia  Aspiration peumonia - on zosyn and vancomycin  Other Pertinent History  Intubated on propofol  TF  Hospital day #  5  This patient is critically ill due to 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 This patient's care requires constant monitoring of vital signs, hemodynamics, respiratory and cardiac monitoring, review of multiple databases, neurological assessment, discussion with family, other specialists and medical decision making of high complexity. I spent 40 minutes of neurocritical care time in the care of this patient.  Rosalin Hawking, MD PhD Stroke Neurology 04/22/2015 6:29 PM    To contact Stroke Continuity provider, please refer to [email or web address] After hours, contact General Neurology

## 2015-04-22 NOTE — Progress Notes (Signed)
Nutrition Follow-up  DOCUMENTATION CODES:   Not applicable  INTERVENTION:    Continue Vital AF 1.2 formula at goal rate of 65 ml/hr  NUTRITION DIAGNOSIS:   Inadequate oral intake related to inability to eat (s/p CVA) as evidenced by NPO status, ongoing  GOAL:   Patient will meet greater than or equal to 90% of their needs, met  MONITOR:   Vent status, TF tolerance, Labs, Weight trends, I & O's  ASSESSMENT:  74 y.o. Male admitted for replacement aortic root and ascending aorta 04/22/2015. Post-op developed s/s of stroke. 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  Patient s/p procedure 7/13: BENTALL PROCEDURE  TRANSESOPHAGEAL ECHOCARDIOGRAM (TEE)  AORTIC VALVE REPLACEMENT (AVR)   Patient is currently intubated on ventilator support MV: 14.3 L/min Temp (24hrs), Avg:98.8 F (37.1 C), Min:98.3 F (36.8 C), Max:99.5 F (37.5 C)   Propofol: 7.8 ml/hr ----> 206 fat kcals   Vital AF 1.2 formula initiated 7/15 -- currently infusing at goal rate of 65 ml/hr via Panda feeding tube (tip in stomach) -- providing 1872 kcals, 117 gm protein, 1265 ml of free water.  Diet Order:  Diet NPO time specified  Skin:  Wound (see comment) (closed incision on chest)  Last BM:  7/17  Height:   Ht Readings from Last 1 Encounters:  04/20/2015 6' (1.829 m)    Weight:   Wt Readings from Last 1 Encounters:  04/22/15 190 lb 0.6 oz (86.2 kg)    Ideal Body Weight:  81 kg  Wt Readings from Last 10 Encounters:  04/22/15 190 lb 0.6 oz (86.2 kg)  04/11/15 189 lb 14.4 oz (86.138 kg)  04/09/15 189 lb (85.73 kg)  04/05/15 189 lb (85.73 kg)  03/28/15 191 lb (86.637 kg)  03/26/15 191 lb (86.637 kg)  03/13/15 188 lb (85.276 kg)  11/02/13 178 lb (80.74 kg)    BMI:  Body mass index is 25.77 kg/(m^2).  Estimated Nutritional Needs:   Kcal:  2075  Protein:  110-120 gm  Fluid:  per MD  EDUCATION NEEDS:   No education needs  identified at this time  Arthur Holms, RD, LDN Pager #: [phone] After-Hours Pager #: [phone]

## 2015-04-22 NOTE — Progress Notes (Signed)
Patient was transported to CT Scan and returned to unit without any complications.

## 2015-04-22 NOTE — Progress Notes (Signed)
5 Days Post-Op Procedure(s) (LRB): BENTALL PROCEDURE (N/A) TRANSESOPHAGEAL ECHOCARDIOGRAM (TEE) (N/A) AORTIC VALVE REPLACEMENT (AVR) (N/A) Subjective:  Intubated and sedated on Propofol but follows commands with right side.  Objective: Vital signs in last 24 hours: Temp:  [98 F (36.7 C)-99.5 F (37.5 C)] 98.6 F (37 C) (07/18 0401) Pulse Rate:  [79-90] 89 (07/18 0700) Cardiac Rhythm:  [-] Atrial paced (07/17 2000)  Sinus brady 30's under pacer. Resp:  [16-28] 24 (07/18 0700) BP: (106-168)/(58-96) 109/58 mmHg (07/18 0700) SpO2:  [90 %-100 %] 100 % (07/18 0700) FiO2 (%):  [40 %-100 %] 40 % (07/18 0401) Weight:  [86.2 kg (190 lb 0.6 oz)] 86.2 kg (190 lb 0.6 oz) (07/18 0336)  Hemodynamic parameters for last 24 hours:    Intake/Output from previous day: 07/17 0701 - 07/18 0700 In: 4400.5 [I.V.:1940.5; NG/GT:1710; IV Piggyback:750] Out: 3735 [Urine:3735] Intake/Output this shift:    Neurologic: squeezes right hand and moves right foot to command. Heart: regular rate and rhythm, S1, S2 normal, no murmur, click, rub or gallop Lungs: clear to auscultation bilaterally Abdomen: soft, non-tender; bowel sounds normal; no masses,  no organomegaly Extremities: edema mild Wound: incisions ok  Lab Results:  Recent Labs  04/21/15 0412 04/22/15 0410  WBC 10.3 8.3  HGB 10.9* 10.1*  HCT 32.5* 31.1*  PLT 141* 164   BMET:  Recent Labs  04/21/15 0412  04/21/15 2017 04/22/15 0410  NA 149*  < > 157* 156*  K 3.6  --   --  3.1*  CL 121*  --   --  129*  CO2 23  --   --  23  GLUCOSE 98  --   --  119*  BUN 21*  --   --  23*  CREATININE 0.77  --   --  0.96  CALCIUM 9.0  --   --  9.4  < > = values in this interval not displayed.  PT/INR: No results for input(s): LABPROT, INR in the last 72 hours. ABG    Component Value Date/Time   PHART 7.453* 04/21/2015 1236   HCO3 21.7 04/21/2015 1236   TCO2 23 04/21/2015 1236   ACIDBASEDEF 2.0 04/21/2015 1236   O2SAT 100.0 04/21/2015 1236    CBG (last 3)   Recent Labs  04/21/15 1940 04/21/15 2348 04/22/15 0401  GLUCAP 112* 133* 104*   CLINICAL DATA: Hypoxia  EXAM: PORTABLE CHEST - 1 VIEW  COMPARISON: April 21, 2015  FINDINGS: Endotracheal tube tip is 3.3 cm above the carina. Feeding tube tip is below the diaphragm. No pneumothorax. Airspace consolidation in the left base remains with opacity obscuring visualization of the left hemidiaphragm. Lungs elsewhere clear. Heart is mildly enlarged with pulmonary vascularity within normal limits. No adenopathy.  IMPRESSION: Tube positions as described without pneumothorax. Persistent opacity felt to be consistent with consolidation left base. No change in cardiac silhouette. No new opacity.   Electronically Signed  By: Levi Shepard M.D.  On: 04/22/2015 07:24  Assessment/Plan: S/P Procedure(s) (LRB): BENTALL PROCEDURE (N/A) TRANSESOPHAGEAL ECHOCARDIOGRAM (TEE) (N/A) AORTIC VALVE REPLACEMENT (AVR) (N/A)  Neuro: Large periop right MCA stroke with cerebral edema and midline shift treated with hypertonic saline. Neurology following.  Resp: Intubated for acute respiratory failure with poor cough, secretions and possible aspiration pneumonia. On Vanc and Zosyn. CCM following.  CV: hemodynamically stable. Marked sinus bradycardia: pacing to maximize BP and cerebral perfusion.  GI/nutrition: tolerating tube feeds at goal.    LOS: 5 days    Levi Shepard 04/22/2015

## 2015-04-23 ENCOUNTER — Inpatient Hospital Stay (HOSPITAL_COMMUNITY): Payer: Medicare Other

## 2015-04-23 DIAGNOSIS — J988 Other specified respiratory disorders: Secondary | ICD-10-CM

## 2015-04-23 DIAGNOSIS — I6340 Cerebral infarction due to embolism of unspecified cerebral artery: Secondary | ICD-10-CM

## 2015-04-23 DIAGNOSIS — Z515 Encounter for palliative care: Secondary | ICD-10-CM

## 2015-04-23 LAB — GLUCOSE, CAPILLARY
GLUCOSE-CAPILLARY: 107 mg/dL — AB (ref 65–99)
GLUCOSE-CAPILLARY: 115 mg/dL — AB (ref 65–99)
Glucose-Capillary: 103 mg/dL — ABNORMAL HIGH (ref 65–99)
Glucose-Capillary: 111 mg/dL — ABNORMAL HIGH (ref 65–99)
Glucose-Capillary: 112 mg/dL — ABNORMAL HIGH (ref 65–99)
Glucose-Capillary: 113 mg/dL — ABNORMAL HIGH (ref 65–99)
Glucose-Capillary: 145 mg/dL — ABNORMAL HIGH (ref 65–99)

## 2015-04-23 LAB — CULTURE, RESPIRATORY W GRAM STAIN

## 2015-04-23 LAB — SODIUM
SODIUM: 156 mmol/L — AB (ref 135–145)
Sodium: 154 mmol/L — ABNORMAL HIGH (ref 135–145)
Sodium: 153 mmol/L — ABNORMAL HIGH (ref 135–145)
Sodium: 152 mmol/L — ABNORMAL HIGH (ref 135–145)

## 2015-04-23 LAB — CULTURE, RESPIRATORY

## 2015-04-23 NOTE — Progress Notes (Signed)
[address]       [place] [address]             [phone]      I met with Levi Shepard daughter this afternoon.  She expressed concerns about his QOL. After meeting with Neurology today, it appears the chances for recovery are not good.  I am in total agreement with her re: QOL being of primary importance.  The family will meet with Palliative care tomorrow morning.  [name] Roxan Hockey, MD [facility] [phone]

## 2015-04-23 NOTE — Progress Notes (Signed)
STROKE TEAM PROGRESS NOTE   HISTORY ORDEAN Shepard is a 74 y.o. 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 Upon waking this AM patient was noted to have a right gaze preference, inability to squeeze left hand, left facial droop and weakness of left leg. 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   Date last known well: Date: 04/18/2015 Time last known well: Unable to determine tPA Given: No: post op AO root replacement, unknown LKW Modified Rankin: Rankin Score=0   SUBJECTIVE (INTERVAL HISTORY) Daughter is at the bedside. The patient still intubated on ventilation but off sedation. Able to open eyes on pain stimulation and following central and peripheral commands, moving RUE spontaneously. Serum sodium is 154 and hypertonic saline restarted.   OBJECTIVE Temp:  [97.2 F (36.2 C)-100.2 F (37.9 C)] 99.8 F (37.7 C) (07/19 1124) Pulse Rate:  [88-90] 89 (07/19 1200) Cardiac Rhythm:  [-] Atrial paced (07/19 0730) Resp:  [17-24] 17 (07/19 1200) BP: (98-161)/(56-111) 146/84 mmHg (07/19 1200) SpO2:  [99 %-100 %] 100 % (07/19 1200) FiO2 (%):  [40 %] 40 % (07/19 1124) Weight:  [194 lb 3.6 oz (88.1 kg)] 194 lb 3.6 oz (88.1 kg) (07/19 0403)   Recent Labs Lab 04/22/15 1930 04/23/15 0006 04/23/15 0353 04/23/15 0822 04/23/15 1214  GLUCAP 118* 112* 107* 111* 103*    Recent Labs Lab 05/04/2015 2123  04/18/15 0450 04/18/15 1615  04/19/15 0350 04/20/15 0400  04/21/15 0412  04/22/15 0410 04/22/15 1050 04/22/15 1650 04/22/15 2221 04/23/15 0430 04/23/15 1100  NA  --   < > 137  --   < > 139 140  < > 149*  < > 156* 158* 154* 158* 154* 156*  K  --   < > 3.9  --   < > 3.5 3.0*  --  3.6  --  3.1*  --   --  3.6  --   --   CL  --   < > 107  --   < > 108 109  --  121*  --  129*  --   --   127*  --   --   CO2  --   < > 22  --   --  24 23  --  23  --  23  --   --  24  --   --   GLUCOSE  --   < > 154*  --   < > 122* 125*  --  98  --  119*  --   --  104*  --   --   BUN  --   < > 16  --   < > 20 21*  --  21*  --  23*  --   --  25*  --   --   CREATININE 1.34*  < > 1.23 1.03  < > 0.92 0.93  --  0.77  --  0.96  --   --  1.03  --   --   CALCIUM  --   < > 8.9  --   --  9.2 9.1  --  9.0  --  9.4  --   --  9.0  --   --   MG 2.6*  --  2.1 1.9  --   --   --   --   --   --   --   --   --   --   --   --   < > =  values in this interval not displayed.  Recent Labs Lab 04/22/15 0410  AST 50*  ALT 33  ALKPHOS 31*  BILITOT 0.8  PROT 5.5*  ALBUMIN 2.4*    Recent Labs Lab 04/19/15 0350 04/20/15 0400 04/21/15 0412 04/22/15 0410 04/22/15 2221  WBC 11.9* 10.4 10.3 8.3 8.2  HGB 11.1* 11.0* 10.9* 10.1* 9.4*  HCT 32.6* 32.1* 32.5* 31.1* 28.8*  MCV 92.1 93.0 94.2 95.1 95.0  PLT 74* 94* 141* 164 195   No results for input(s): CKTOTAL, CKMB, CKMBINDEX, TROPONINI in the last 168 hours. No results for input(s): LABPROT, INR in the last 72 hours. No results for input(s): COLORURINE, LABSPEC, [place], GLUCOSEU, HGBUR, BILIRUBINUR, KETONESUR, PROTEINUR, UROBILINOGEN, NITRITE, LEUKOCYTESUR in the last 72 hours.  Invalid input(s): APPERANCEUR     Component Value Date/Time   CHOL 90 04/20/2015 0400   TRIG 93 04/21/2015 1245   HDL 22* 04/20/2015 0400   CHOLHDL 4.1 04/20/2015 0400   VLDL 22 04/20/2015 0400   LDLCALC 46 04/20/2015 0400   Lab Results  Component Value Date   HGBA1C 5.8* 04/11/2015   No results found for: LABOPIA, COCAINSCRNUR, LABBENZ, AMPHETMU, THCU, LABBARB  No results for input(s): ETH in the last 168 hours.   Imaging I have personally reviewed the radiological images below and agree with the radiology interpretations.  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     04/21/15 1. Similar  appearance of subacute infarct involving much of the right cerebral hemisphere in the distribution of the right middle cerebral artery. 2. Stable edema and mass-effect with right to left midline shift measuring 9 mm.  04/23/15 Evolving subacute large RIGHT middle cerebral artery territory infarct without further propagation. Linear areas of petechial hemorrhage, and possible cortical venous stasis versus subarachnoid blood at the RIGHT frontal convexity without lobar hematoma. Increasing mass effect resulting in 15 mm RIGHT to LEFT midline shift, previously 9 mm without ventricular entrapment.  CT angio brain 04/20/15 : 1. Occlusion of the distal right MCA M1 segment with moderate to poor right MCA territory collateral flow. 2. Evolved complete right MCA territory infarct with increased cytotoxic edema and mass effect. Leftward midline shift now 8 mm. No associated hemorrhage.  TTE 04/01/15 - - Left ventricle: The cavity size was normal. There was moderate concentric hypertrophy. Systolic function was normal. The estimated ejection fraction was in the range of 50% to 55%. Wall motion was normal; there were no regional wall motion abnormalities. Due to first degree atrioventricular block, there was fusion of early and atrial contributions to ventricular filling. - Ventricular septum: Septal motion showed paradox. - Aortic valve: Valve area (VTI): 3.56 cm^2. Valve area (Vmean): 3.33 cm^2. - Aortic root: The aortic root was moderately dilated. - Ascending aorta: The ascending aorta was severely dilated. - Mitral valve: There was mild to moderate regurgitation directed centrally. - Left atrium: The atrium was mildly dilated.  CUS - Bilateral: 1-39% ICA stenosis. Vertebral artery flow is antegrade.  PHYSICAL EXAM   Temp:  [97.2 F (36.2 C)-100.2 F (37.9 C)] 99.8 F (37.7 C) (07/19 1124) Pulse Rate:  [88-90] 89 (07/19 1200) Resp:  [17-24] 17 (07/19 1200) BP:  (98-161)/(56-111) 146/84 mmHg (07/19 1200) SpO2:  [99 %-100 %] 100 % (07/19 1200) FiO2 (%):  [40 %] 40 % (07/19 1124) Weight:  [194 lb 3.6 oz (88.1 kg)] 194 lb 3.6 oz (88.1 kg) (07/19 0403)  General - Well nourished,  well developed, intubated off sedation.  Ophthalmologic - Fundi not visualized due to small pupils.  Cardiovascular - Regular rate and rhythm with no murmur.  Neuro - intubated off sedation, very lethargic, on voice stimulation, he briefly open eyes, able to follow central and peripheral commands. Eyes deviated to the right, left neglect, not blinking to visual threat. PERRL, left facial droop, LUE 0/5, LLE 0/5 with pain stimulation 1/5. RUE spontaneous movement, RLE mild withdraw with pain stimulation. sensation, coordination and gait not tested.   ASSESSMENT/PLAN [name] is a 74 y.o. 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  He did not receive IV t-PA due to recent surgery and unknown time of onset.  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  Repeat CT head showed increasing midline shift now 15mm with small hemorrhagic transformation vs. Venous statsis on the right.    CTA- Occluded distal right M1  Carotid Doppler unremarkable.   2D Echo - EF 50-55%. No cardiac source of emboli identified.  LDL 46  HgbA1c 5.8  lovenox for VTE prophylaxis Diet NPO time specified  aspirin 81 mg orally every day prior to admission, now on ASA 325mg  daily.   Ongoing aggressive stroke risk factor management  Cerebral edema  Intubated  now  3% saline restarted  Na 159->154  Goal Na 150-160  Na Q6h  Repeat CT showed increasing midline shift and small hemorrhagic transformation vs. Venous stasis  Had a long discussion with daughter and she firmly stated that her dad will not accept the condition of living in nursing home with no quality of life. She requested palliative care consult  Palliative care consult requested.  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 This patient's care requires constant monitoring of vital signs, hemodynamics, respiratory and cardiac monitoring, review of multiple databases, neurological assessment, discussion with family, other specialists and medical decision making of high complexity.Had a long discussion with daughter and she firmly stated that her dad will not accept the condition of living in nursing home with no quality of life. She requested palliative care consult. I spent 45 minutes of neurocritical care time in the care of this patient.  Rosalin Hawking, MD PhD Stroke Neurology 04/23/2015 12:48 PM    To contact Stroke Continuity provider, please refer to [email or web address] After hours, contact General Neurology

## 2015-04-23 NOTE — Progress Notes (Signed)
PULMONARY / CRITICAL CARE MEDICINE   Name: Levi Shepard MRN: [id number] DOB: Mar 03, 1941    ADMISSION DATE:  05/03/2015 CONSULTATION DATE:  04/21/2015  REFERRING MD :  Ricard Dillon  CHIEF COMPLAINT:  Short of breath  INITIAL PRESENTATION:  74 yo male with ascending aortic aneurysm and AI had Bentall procedure 7/13.  Developed slurred speech and Lt sided weakness post-op from Rt CVA.  Developed aspiration with respiratory failure and PCCM consulted.  STUDIES:  7/17 CT head >> massive infarct Rt cerebral hemisphere, edema, 9 mm Rt to Lt shift 7/18 head CT >> increased shift 15 mm  SIGNIFICANT EVENTS: 7/13 Bentall procedure 7/14 CVA, neuro consulted 7/16 Started 3% NS  7/17 Goals of care changed to limited resuscitation >> okay with short term intubation  SUBJECTIVE: afebrile Intubated, RASS 0 Good UO   VITAL SIGNS: Temp:  [97.2 F (36.2 C)-100.2 F (37.9 C)] 100.2 F (37.9 C) (07/19 0757) Pulse Rate:  [88-90] 90 (07/19 0755) Resp:  [13-24] 20 (07/19 0755) BP: (98-165)/(56-111) 125/74 mmHg (07/19 0755) SpO2:  [99 %-100 %] 100 % (07/19 0755) FiO2 (%):  [40 %] 40 % (07/19 0757) Weight:  [194 lb 3.6 oz (88.1 kg)] 194 lb 3.6 oz (88.1 kg) (07/19 0403) HEMODYNAMICS:   VENTILATOR SETTINGS: Vent Mode:  [-] CPAP;PSV FiO2 (%):  [40 %] 40 % Set Rate:  [22 bmp] 22 bmp Vt Set:  [350 mL] 620 mL PEEP:  [5 cmH20] 5 cmH20 Pressure Support:  [10 cmH20] 10 cmH20 Plateau Pressure:  [9 cmH20-20 cmH20] 20 cmH20 INTAKE / OUTPUT:  Intake/Output Summary (Last 24 hours) at 04/23/15 0844 Last data filed at 04/23/15 0700  Gross per 24 hour  Intake 3575.45 ml  Output   2745 ml  Net 830.45 ml    PHYSICAL EXAMINATION: General:acutely  ill appearing Neuro:  Hemiplegic on Lt side, RASS 0, power 3+/5 on right, follows commands HEENT:  No jvd Cardiovascular:  regular Lungs:  B/l crackles Abdomen:  Soft, non tender Musculoskeletal:  No edema Skin:  No rashes  LABS:  CBC  Recent Labs Lab  04/21/15 0412 04/22/15 0410 04/22/15 2221  WBC 10.3 8.3 8.2  HGB 10.9* 10.1* 9.4*  HCT 32.5* 31.1* 28.8*  PLT 141* 164 195   Coag's  Recent Labs Lab 04/16/2015 1530  APTT 32  INR 1.53*   BMET  Recent Labs Lab 04/21/15 0412  04/22/15 0410  04/22/15 1650 04/22/15 2221 04/23/15 0430  NA 149*  < > 156*  < > 154* 158* 154*  K 3.6  --  3.1*  --   --  3.6  --   CL 121*  --  129*  --   --  127*  --   CO2 23  --  23  --   --  24  --   BUN 21*  --  23*  --   --  25*  --   CREATININE 0.77  --  0.96  --   --  1.03  --   GLUCOSE 98  --  119*  --   --  104*  --   < > = values in this interval not displayed.   Electrolytes  Recent Labs Lab 04/23/2015 2123 04/18/15 0450 04/18/15 1615  04/21/15 0412 04/22/15 0410 04/22/15 2221  CALCIUM  --  8.9  --   < > 9.0 9.4 9.0  MG 2.6* 2.1 1.9  --   --   --   --   < > = values  in this interval not displayed.   ABG  Recent Labs Lab 04/13/2015 1532 04/18/15 0335 04/21/15 1236  PHART 7.359 7.447 7.453*  PCO2ART 46.0* 32.5* 30.9*  PO2ART 69.0* 67.0* 209.0*   Liver Enzymes  Recent Labs Lab 04/22/15 0410  AST 50*  ALT 33  ALKPHOS 31*  BILITOT 0.8  ALBUMIN 2.4*    Glucose  Recent Labs Lab 04/22/15 0802 04/22/15 1220 04/22/15 1554 04/22/15 1930 04/23/15 0006 04/23/15 0353  GLUCAP 135* 108* 110* 118* 112* 107*    Imaging Ct Head Wo Contrast  04/22/2015   CLINICAL DATA:  Follow-up subacute infarct with edema. History of hypertension, hyperlipidemia, colon cancer.  EXAM: CT HEAD WITHOUT CONTRAST  TECHNIQUE: Contiguous axial images were obtained from the base of the skull through the vertex without intravenous contrast.  COMPARISON:  CT head April 21, 2015  FINDINGS: Wedge-like RIGHT frontotemporal parietal hypodensity is similar in distribution, with streaky densities consistent with petechial hemorrhage. Increasing edema, with 15 mm RIGHT to LEFT midline shift, previously 9 mm. Linear density at RIGHT frontal convexity may  be extra-axial, axial 26/32. RIGHT lateral ventricle effacement, no entrapment/ hydrocephalus. No new large vascular territory infarct. Effaced RIGHT basal cistern, mild RIGHT uncal herniation.  No abnormal extra-axial fluid collections. Paranasal sinus mucosal thickening with frothy secretions LEFT maxillary sinus, life-support lines in place. Mastoid air cells are well aerated. Ocular globes and orbital contents are normal. No skull fracture.  IMPRESSION: Evolving subacute large RIGHT middle cerebral artery territory infarct without further propagation. Linear areas of petechial hemorrhage, and possible cortical venous stasis versus subarachnoid blood at the RIGHT frontal convexity without lobar hematoma.  Increasing mass effect resulting in 15 mm RIGHT to LEFT midline shift, previously 9 mm without ventricular entrapment.   Electronically Signed   By: Levi Shepard M.D.   On: 04/22/2015 21:59   Dg Chest Port 1 View  04/23/2015   CLINICAL DATA:  Status post aortic valve replacement -than tall procedure on July 13th for ascending aortic aneurysm, aspiration pneumonia.  EXAM: PORTABLE CHEST - 1 VIEW  COMPARISON:  Portable chest x-ray of April 22, 2015  FINDINGS: The lungs are slightly less well inflated today. The retrocardiac region remains dense and the left hemidiaphragm remains obscured. The cardiac silhouette is enlarged. The central pulmonary vascularity is mildly prominent. The endotracheal tube tip lies 3 cm above the carina. The feeding tube tip projects below the inferior margin of the image.  IMPRESSION: Persistent left lower lobe atelectasis or pneumonia. Persistent low-grade CHF. Overall there has been little change since yesterday's study allowing for differences in positioning. The support tubes are in reasonable position.   Electronically Signed   By: Levi Shepard M.D.   On: 04/23/2015 07:24     ASSESSMENT / PLAN:  PULMONARY ETT 7/17 >> A: Compromised airway in setting of CVA and  aspiration pneumonia. P:   Tolerates SBTs ok but no extubation No benefit of hyperventilation in this setting  CARDIOVASCULAR Rt IJ Introducer 7/13 >> A:  Aortic aneurysm s/p Bentall procedure. Hx of HTN, HLD. P:  Per TCTS BP control  RENAL A:   Medically induced hypernatremia - on 3% saline P:   Goal Na 150 to 155 per neurology Follow Na every 4-6 h  GASTROINTESTINAL A:   Nutrition. P:   Ct Tube feeds Protonix for SUP  HEMATOLOGIC A:   Anemia of critical illness. Mild thrombocytopenia. P:  F/u CBC Lovenox for DVT prevention  INFECTIOUS A:   Aspiration pneumonia. P:  7/17 >> vancomycin, zosyn  Sputum 7/17 >> GNR >>  ENDOCRINE A:   Hyperglycemia. P:   SSI  NEUROLOGIC A:   Acute CVA with cerebral edema & shift, likely embolic  P:   RASS goal 0 Neurology following 3% saline per protocol  Summary - Massive Rt CVA post Bentall with intubation for  Aspiration. Guarded prognosis, Follows commands, may need early trach if aggressive care desired.  The patient is critically ill with multiple organ systems failure and requires high complexity decision making for assessment and support, frequent evaluation and titration of therapies, application of advanced monitoring technologies and extensive interpretation of multiple databases. Critical Care Time devoted to patient care services described in this note independent of APP time is 31 minutes.    Kara Mead MD. Shade Flood. [facility] Pager [phone] If no response call [phone]    04/23/2015, 8:44 AM

## 2015-04-23 NOTE — Progress Notes (Signed)
6 Days Post-Op Procedure(s) (LRB): BENTALL PROCEDURE (N/A) TRANSESOPHAGEAL ECHOCARDIOGRAM (TEE) (N/A) AORTIC VALVE REPLACEMENT (AVR) (N/A) Subjective: Intubated, sedated  Objective: Vital signs in last 24 hours: Temp:  [97.2 F (36.2 C)-99.4 F (37.4 C)] 97.2 F (36.2 C) (07/19 0400) Pulse Rate:  [88-90] 90 (07/19 0755) Cardiac Rhythm:  [-] Atrial paced (07/19 0000) Resp:  [13-24] 20 (07/19 0755) BP: (98-165)/(56-111) 125/74 mmHg (07/19 0755) SpO2:  [99 %-100 %] 100 % (07/19 0755) FiO2 (%):  [40 %] 40 % (07/19 0757) Weight:  [194 lb 3.6 oz (88.1 kg)] 194 lb 3.6 oz (88.1 kg) (07/19 0403)  Hemodynamic parameters for last 24 hours:    Intake/Output from previous day: 07/18 0701 - 07/19 0700 In: 3714.1 [I.V.:1294.1; NG/GT:1670; IV Piggyback:750] Out: 2870 [Urine:2870] Intake/Output this shift:    Neurologic: sedated Heart: regular rate and rhythm Lungs: clear to auscultation bilaterally Abdomen: normal findings: soft, non-tender  Lab Results:  Recent Labs  04/22/15 0410 04/22/15 2221  WBC 8.3 8.2  HGB 10.1* 9.4*  HCT 31.1* 28.8*  PLT 164 195   BMET:  Recent Labs  04/22/15 0410  04/22/15 2221 04/23/15 0430  NA 156*  < > 158* 154*  K 3.1*  --  3.6  --   CL 129*  --  127*  --   CO2 23  --  24  --   GLUCOSE 119*  --  104*  --   BUN 23*  --  25*  --   CREATININE 0.96  --  1.03  --   CALCIUM 9.4  --  9.0  --   < > = values in this interval not displayed.  PT/INR: No results for input(s): LABPROT, INR in the last 72 hours. ABG    Component Value Date/Time   PHART 7.453* 04/21/2015 1236   HCO3 21.7 04/21/2015 1236   TCO2 23 04/21/2015 1236   ACIDBASEDEF 2.0 04/21/2015 1236   O2SAT 100.0 04/21/2015 1236   CBG (last 3)   Recent Labs  04/22/15 1930 04/23/15 0006 04/23/15 0353  GLUCAP 118* 112* 107*    Assessment/Plan: S/P Procedure(s) (LRB): BENTALL PROCEDURE (N/A) TRANSESOPHAGEAL ECHOCARDIOGRAM (TEE) (N/A) AORTIC VALVE REPLACEMENT (AVR) (N/A) -   NEURO- s/p R MCA infarct- midline shift, on hypertonic saline  Sedated on ventilator  CV- stable hemodynamics  Still atrially paced- appears to be in SR under the pacer  Will check ECG  RESP- VDRF presumed aspiration pneumonia  Vent per CCM  On vanco and zosyn- day 3  RENAL- hypernatremia, medically induced- sodium 154 this AM  Plan per Neuro  Hypokalemia improved  ENDO- CBG well controlled  GI/ Nutrition- on goal TF  SCD for DVT prophylaxis    LOS: 6 days    Levi Shepard 04/23/2015

## 2015-04-23 NOTE — Plan of Care (Signed)
Problem: Acute Treatment Outcomes Goal: Neuro exam at baseline or improved Outcome: Not Met (add Reason) Pt not progressing. Palliative care consult for next AM.

## 2015-04-23 NOTE — Consult Note (Signed)
Consultation Note Date: 04/23/2015   Patient Name: Levi Shepard  DOB: Jun 16, 1941  MRN: [id number]  Age / Sex: 74 y.o., male   PCP: Myrtis Hopping, MD Referring Physician: Melrose Nakayama, MD  Reason for Consultation: Establishing goals of care  Palliative Care Assessment and Plan Summary of Established Goals of Care and Medical Treatment Preferences    Palliative Care Discussion Held Today:   Long discussion with Mr. Koenigs daughter, Caryl Pina, who is his 69 and loving daughter. Caryl Pina has good understanding of the events that have led him to this condition and his poor prognosis. She is greatly appreciative of the time spent by Dr. Erlinda Hong. 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 His wife of 73 yrs and her mother is in a facility in [place] with Alzheimer's dementia and this has been very difficult on him. 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 We plan to meet again tomorrow morning at 0830 with family as previously set by Dr. Elsworth Soho and will proceed with one way extubation and comfort care at this point. Discussed hospice facility vs hospital death depending on outcome of extubation.    Contacts/Participants in Discussion: Primary Decision Maker: daughter Caryl Pina   Goals of Care/Code Status/Advance Care Planning:   Code Status: DNR  Transition to comfort tomorrow morning.    Symptom Management:   Pain/Dyspnea: Will assess prn needs and will consider low dose infusion of fentanyl to assist with extubation.   I will also give ativan prior to extubation and have prn available to prevent seizure considering stroke and midline shift.   Psycho-social/Spiritual:   Support System: Great support with multiple  family members.   Prognosis: Hours - Days  Discharge Planning:  Hospice facility vs hospice death.        Chief Complaint/HPI: 74 yo admitted for aortic aneurysm repair requiring Bentall procedure and aortic valve replacement. Unfortunately course complicated by "extensive nonhemorrhagic right middle cerebral artery infarct with 2 mm of midline shift" right to left that has extended to 9 mm and 15 mm on 04/22/15. Otherwise he was an extremely healthy and active 74 yo man.   Primary Diagnoses  Present on Admission:  . Thoracic ascending aortic aneurysm  Palliative Review of Systems:   Unable to assess - intubated.    I have reviewed the medical record, interviewed the patient and family, and examined the patient. The following aspects are pertinent.  Past Medical History  Diagnosis Date  . Acute sinusitis, unspecified   . Ascending aortic aneurysm     4.8cm x 4.6cm in 2013  . Personal history of colonic polyps     x1-has surveillance colonoscopies.Complete Colonoscopy;with polypectomy [phone]; has surveillance colonoscopies.   . Aortic valve insufficiency   . Benign neoplasm of colon   . 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 Chest pain, unspecified   . Other dyspnea and respiratory abnormality   . Hypertension   . Hyperlipidemia   . Other testicular hypofunction   . Impotence of organic origin   . Nonspecific elevation of levels of transaminase or lactic acid dehydrogenase (LDH)   . Disorders of bursae and  tendons in shoulder region, unspecified     Right  . Special screening for malignant neoplasm of prostate   . Complication of anesthesia   . PONV (postoperative nausea and vomiting)   . Heart murmur   . Unspecified asthma(493.90)     h/o, 2-3 yrs. since using albuterol   . History of hiatal hernia   . Arthritis     back & R shoulder  . Cancer     facial- basal cell    History   Social History  .  Marital Status: Married    Spouse Name: N/A  . Number of Children: N/A  . Years of Education: N/A   Social History Main Topics  . Smoking status: Never Smoker   . Smokeless tobacco: Not on file  . Alcohol Use: Yes     Comment: ocassional wine and beer  . Drug Use: No  . Sexual Activity: Not on file   Other Topics Concern  . None   Social History Narrative   Family History  Problem Relation Age of Onset  . Anemia Neg Hx   . Arrhythmia Neg Hx   . Asthma Neg Hx   . Fainting Neg Hx   . Clotting disorder Neg Hx   . Heart attack Neg Hx   . Heart disease Neg Hx   . Heart failure Neg Hx   . Hyperlipidemia Neg Hx   . Hypertension Neg Hx    Scheduled Meds: . antiseptic oral rinse  7 mL Mouth Rinse QID  . aspirin EC  325 mg Oral Daily   Or  . aspirin  324 mg Per Tube Daily  . bisacodyl  10 mg Rectal Daily  . chlorhexidine  15 mL Mouth Rinse BID  . docusate sodium  200 mg Oral Daily  . enoxaparin (LOVENOX) injection  30 mg Subcutaneous Q24H  . insulin aspart  0-24 Units Subcutaneous 6 times per day  . insulin detemir  20 Units Subcutaneous Daily  . pantoprazole sodium  40 mg Per Tube Daily  . piperacillin-tazobactam (ZOSYN)  IV  3.375 g Intravenous 3 times per day  . sodium chloride  3 mL Intravenous Q12H  . vancomycin  1,000 mg Intravenous Q8H   Continuous Infusions: . sodium chloride 20 mL (05/02/2015 1545)  . sodium chloride Stopped (04/23/15 0600)  . sodium chloride 10 mL/hr at 04/22/15 1132  . feeding supplement (VITAL AF 1.2 CAL) 1,000 mL (04/23/15 1400)  . propofol (DIPRIVAN) infusion 10 mcg/kg/min (04/23/15 0600)  . sodium chloride (hypertonic) Stopped (04/23/15 1245)   PRN Meds:.sodium chloride, fentaNYL (SUBLIMAZE) injection, ondansetron (ZOFRAN) IV, sodium chloride Medications Prior to Admission:  Prior to Admission medications   Medication Sig Start Date End Date Taking? Authorizing Provider  aspirin EC 81 MG tablet Take 1 tablet (81 mg total) by mouth daily.  03/28/15  Yes Dorothy Spark, MD  Cholecalciferol (VITAMIN D PO) Take 400 Units by mouth daily.   Yes Historical Provider, MD  cyclobenzaprine (FLEXERIL) 10 MG tablet Take 5-10 mg by mouth at bedtime as needed for muscle spasms.   Yes Historical Provider, MD  ERTACZO 2 % CREA Apply 1 application topically daily as needed (rash).  10/21/11  Yes Historical Provider, MD  fenofibrate micronized (LOFIBRA) 67 MG capsule Take 67 mg by mouth daily.   Yes Historical Provider, MD  lovastatin (MEVACOR) 20 MG tablet Take 20 mg by mouth daily. 03/27/15  Yes Historical Provider, MD  meloxicam (MOBIC) 15 MG tablet Take 15 mg by  mouth daily. 03/22/15  Yes Historical Provider, MD  methocarbamol (ROBAXIN) 500 MG tablet Take 500 mg by mouth every 8 (eight) hours as needed.  03/22/15  Yes Historical Provider, MD  Multiple Vitamin (MULTIVITAMIN) capsule Take 1 capsule by mouth daily.   Yes Historical Provider, MD  Tamsulosin HCl (FLOMAX) 0.4 MG CAPS Take 0.4 mg by mouth at bedtime.   Yes Historical Provider, MD  Triamcinolone Acetonide (NASACORT ALLERGY 24HR NA) Place 1 spray into both nostrils daily as needed (allergies).   Yes Historical Provider, MD   Allergies  Allergen Reactions  . Demerol Nausea And Vomiting    NAUSEA AND VOMITTING  . Morphine Hives and Rash   CBC:    Component Value Date/Time   WBC 8.2 04/22/2015 2221   HGB 9.4* 04/22/2015 2221   HCT 28.8* 04/22/2015 2221   PLT 195 04/22/2015 2221   MCV 95.0 04/22/2015 2221   NEUTROABS 3.6 03/28/2015 1310   LYMPHSABS 2.3 03/28/2015 1310   MONOABS 0.7 03/28/2015 1310   EOSABS 0.2 03/28/2015 1310   BASOSABS 0.0 03/28/2015 1310   Comprehensive Metabolic Panel:    Component Value Date/Time   NA 156* 04/23/2015 1100   K 3.6 04/22/2015 2221   CL 127* 04/22/2015 2221   CO2 24 04/22/2015 2221   BUN 25* 04/22/2015 2221   CREATININE 1.03 04/22/2015 2221   CREATININE 1.38* 03/19/2015 1406   GLUCOSE 104* 04/22/2015 2221   CALCIUM 9.0 04/22/2015 2221     AST 50* 04/22/2015 0410   ALT 33 04/22/2015 0410   ALKPHOS 31* 04/22/2015 0410   BILITOT 0.8 04/22/2015 0410   PROT 5.5* 04/22/2015 0410   ALBUMIN 2.4* 04/22/2015 0410    Physical Exam:  Vital Signs: BP 151/96 mmHg  Pulse 89  Temp(Src) 99.8 F (37.7 C) (Oral)  Resp 17  Ht 6' (1.829 m)  Wt 88.1 kg (194 lb 3.6 oz)  BMI 26.34 kg/m2  SpO2 100% SpO2: SpO2: 100 % O2 Device: O2 Device: Ventilator O2 Flow Rate: O2 Flow Rate (L/min): 5 L/min Intake/output summary:  Intake/Output Summary (Last 24 hours) at 04/23/15 1443 Last data filed at 04/23/15 1400  Gross per 24 hour  Intake 3033.15 ml  Output   2545 ml  Net 488.15 ml   LBM: Last BM Date: 04/23/15 Baseline Weight: Weight: 85.73 kg (189 lb) Most recent weight: Weight: 88.1 kg (194 lb 3.6 oz)  Exam Findings:  General: NAD, intubated HEENT: ETT in place, moist mucous membranes CVS: Paced Resp: Mechanical ventilation, no labored breathing, initiating breathes Abd: Soft, NT, ND Extrem: Warm, dry Neuro: Intubated, attempted to open eyes to command, spontaneous movement of right arm           Palliative Performance Scale: 10-20 %                Additional Data Reviewed: Recent Labs     04/22/15  0410   04/22/15  2221  04/23/15  0430  04/23/15  1100  WBC  8.3   --   8.2   --    --   HGB  10.1*   --   9.4*   --    --   PLT  164   --   195   --    --   NA  156*   < >  158*  154*  156*  BUN  23*   --   25*   --    --   CREATININE  0.96   --  1.03   --    --    < > = values in this interval not displayed.     Time In: 1440 Time Out: 1540 Time Total: 65min  Greater than 50%  of this time was spent counseling and coordinating care related to the above assessment and plan.   Signed by:  Vinie Sill, NP Palliative Medicine Team Pager # [phone] (M-F 8a-5p) Team Phone # [phone] (Nights/Weekends)

## 2015-04-24 ENCOUNTER — Inpatient Hospital Stay (HOSPITAL_COMMUNITY): Payer: Medicare Other

## 2015-04-24 LAB — BASIC METABOLIC PANEL
ANION GAP: 9 (ref 5–15)
BUN: 25 mg/dL — AB (ref 6–20)
CHLORIDE: 121 mmol/L — AB (ref 101–111)
CO2: 24 mmol/L (ref 22–32)
Calcium: 9.1 mg/dL (ref 8.9–10.3)
Creatinine, Ser: 1 mg/dL (ref 0.61–1.24)
GFR calc non Af Amer: >60 mL/min (ref >60)
GLUCOSE: 113 mg/dL — AB (ref 65–99)
Potassium: 3.3 mmol/L — ABNORMAL LOW (ref 3.5–5.1)
SODIUM: 154 mmol/L — AB (ref 135–145)

## 2015-04-24 LAB — GLUCOSE, CAPILLARY
Glucose-Capillary: 105 mg/dL — ABNORMAL HIGH (ref 65–99)
Glucose-Capillary: 120 mg/dL — ABNORMAL HIGH (ref 65–99)

## 2015-04-24 LAB — CBC
HEMATOCRIT: 30.7 % — AB (ref 39.0–52.0)
Hemoglobin: 10 g/dL — ABNORMAL LOW (ref 13.0–17.0)
MCH: 31.2 pg (ref 26.0–34.0)
MCHC: 32.6 g/dL (ref 30.0–36.0)
MCV: 95.6 fL (ref 78.0–100.0)
Platelets: 263 10*3/uL (ref 150–400)
RBC: 3.21 MIL/uL — ABNORMAL LOW (ref 4.22–5.81)
RDW: 14.2 % (ref 11.5–15.5)
WBC: 8.1 10*3/uL (ref 4.0–10.5)

## 2015-04-24 MED ORDER — SODIUM CHLORIDE 0.9 % IV SOLN
1.0000 mg/h | INTRAVENOUS | Status: DC
  Administered 2015-04-24: 1 mg/h via INTRAVENOUS
  Filled 2015-04-24 (×2): qty 10

## 2015-04-24 MED ORDER — SODIUM CHLORIDE 0.9 % IV SOLN
100.0000 ug/h | INTRAVENOUS | Status: DC
  Filled 2015-04-24: qty 50

## 2015-04-24 MED ORDER — GLYCOPYRROLATE 0.2 MG/ML IJ SOLN
0.4000 mg | INTRAMUSCULAR | Status: DC | PRN
  Administered 2015-04-24: 0.4 mg via INTRAVENOUS
  Filled 2015-04-24 (×2): qty 2

## 2015-04-24 MED ORDER — MIDAZOLAM BOLUS VIA INFUSION
5.0000 mg | INTRAVENOUS | Status: DC | PRN
  Filled 2015-04-24: qty 20

## 2015-04-24 MED ORDER — FENTANYL CITRATE (PF) 100 MCG/2ML IJ SOLN
50.0000 ug | INTRAMUSCULAR | Status: DC | PRN
Start: 2015-04-24 — End: 2015-04-24

## 2015-04-24 MED ORDER — FENTANYL BOLUS VIA INFUSION
50.0000 ug | INTRAVENOUS | Status: DC | PRN
  Filled 2015-04-24: qty 200

## 2015-04-24 MED ORDER — POLYVINYL ALCOHOL 1.4 % OP SOLN
1.0000 [drp] | Freq: Four times a day (QID) | OPHTHALMIC | Status: DC | PRN
  Filled 2015-04-24: qty 15

## 2015-04-24 MED ORDER — GLYCOPYRROLATE 0.2 MG/ML IJ SOLN
0.4000 mg | Freq: Once | INTRAMUSCULAR | Status: DC
  Filled 2015-04-24: qty 2

## 2015-04-24 MED ORDER — ATROPINE SULFATE 1 % OP SOLN
4.0000 [drp] | OPHTHALMIC | Status: DC | PRN
  Filled 2015-04-24: qty 2

## 2015-04-24 MED ORDER — LORAZEPAM 2 MG/ML IJ SOLN: 1.0000 mg | INTRAMUSCULAR | Status: DC | PRN

## 2015-04-24 MED ORDER — POTASSIUM CHLORIDE 10 MEQ/50ML IV SOLN
10.0000 meq | INTRAVENOUS | Status: AC | PRN
  Administered 2015-04-24 (×3): 10 meq via INTRAVENOUS
  Filled 2015-04-24 (×2): qty 50

## 2015-04-24 MED ORDER — CEFTRIAXONE SODIUM IN DEXTROSE 20 MG/ML IV SOLN
1.0000 g | INTRAVENOUS | Status: DC
  Filled 2015-04-24: qty 50

## 2015-04-24 MED ORDER — POTASSIUM CHLORIDE 10 MEQ/50ML IV SOLN
10.0000 meq | INTRAVENOUS | Status: AC
  Administered 2015-04-24 (×2): 10 meq via INTRAVENOUS
  Filled 2015-04-24 (×2): qty 50

## 2015-04-24 MED ORDER — SODIUM CHLORIDE 0.9 % IV SOLN
20.0000 ug/h | INTRAVENOUS | Status: DC
  Administered 2015-04-24: 20 ug/h via INTRAVENOUS
  Filled 2015-04-24: qty 50

## 2015-04-24 MED ORDER — SODIUM CHLORIDE 0.9 % IV SOLN: INTRAVENOUS | Status: DC

## 2015-05-01 NOTE — Discharge Summary (Signed)
Physician Discharge Summary  Patient ID: Levi Shepard MRN: [id number] DOB/AGE: 10-20-1940 74 y.o.  Admit date: 04/30/2015 Discharge date: 05/01/2015  Admission Diagnoses: Thoracic ascending aneurysm Aortic insufficiency Hyperlipidemia Hypertension BPH  Discharge Diagnoses:  Active Problems:   Thoracic ascending aortic aneurysm   Cerebral embolism with cerebral infarction   Cytotoxic brain edema   Compromised airway   Aspiration pneumonia   Palliative care encounter Aortic insufficiency Hyperlipidemia Hypertension BPH   Discharged Condition: expired  Hospital Course: Mr. Popoff was admitted on 04/25/2015 and underwent replacement of aortic root (biologic Bentall) and ascending aortic replacement using moderate hypothermic circulatory arrest with antegrade cerebral perfusion (Medtronic freestyle aortic root heart valve, 29 mm (serial # [id number]), 30-mm Hemashield graft( serial # [id number]).  He was hemodynamically stable postop but early in the morning of postoperative day 1 he was noted to have a left hemiparesis. He was able to be weaned from the ventilator. A head CT showed a large right MCA infarct with 2 mm of midline shift. Neurology was consulted. Initially he had a left hemiparesis and neglect. He became progressively less responsive. He was started on hypertonic saline per Neuro's recommendation. He had difficulty clearing secretions and was electively intubated. He was started on Vancomycin and Zosyn for possible aspiration pneumonia.  His medical condition stabilized but his mental status did not improve. On repeat head CT he progressive edema and midline shift despite interventions. Critical care was consulted for ventilator management. A Panda tube was placed and tube feedings initiated.  The family conveyed his strong wishes not be kept alive by artificial means. Mr Jamar had also expressed these same desires preop. With his condition failing to improve Palliative care  was consulted. After extensive discussions with myself, CCM, Neurology and Palliative care the family requested withdrawal of artificial life support and institution of comfort measures per his wishes. He expired later that evening.   Consults: pulmonary/intensive care, neurology and Palliative Care  Significant Diagnostic Studies: labs: - and radiology: CXR: - and CT scan: large Right MCA infarct with progressive edema on serial CTs  Treatments: IV hydration, antibiotics: vancomycin and Zosyn, cardiac meds: -, insulin: Humalog, respiratory therapy: intubation, mechanical ventilation   Discharge Exam: Blood pressure 82/53, pulse 40, temperature 99.5 F (37.5 C), temperature source Axillary, resp. rate 17, height 6' (1.829 m), weight 195 lb 12.3 oz (88.8 kg), SpO2 92 %. expired  Disposition: 20-Expired     Medication List    ASK your doctor about these medications        aspirin EC 81 MG tablet  Take 1 tablet (81 mg total) by mouth daily.     cyclobenzaprine 10 MG tablet  Commonly known as:  FLEXERIL  Take 5-10 mg by mouth at bedtime as needed for muscle spasms.     ERTACZO 2 % Crea  Generic drug:  Sertaconazole Nitrate  Apply 1 application topically daily as needed (rash).     fenofibrate micronized 67 MG capsule  Commonly known as:  LOFIBRA  Take 67 mg by mouth daily.     lovastatin 20 MG tablet  Commonly known as:  MEVACOR  Take 20 mg by mouth daily.     meloxicam 15 MG tablet  Commonly known as:  MOBIC  Take 15 mg by mouth daily.     methocarbamol 500 MG tablet  Commonly known as:  ROBAXIN  Take 500 mg by mouth every 8 (eight) hours as needed.     multivitamin capsule  Take 1 capsule by  mouth daily.     NASACORT ALLERGY 24HR NA  Place 1 spray into both nostrils daily as needed (allergies).     tamsulosin 0.4 MG Caps capsule  Commonly known as:  FLOMAX  Take 0.4 mg by mouth at bedtime.     VITAMIN D PO  Take 400 Units by mouth daily.          Signed: Melrose Nakayama 05/01/2015, 3:21 PM

## 2015-05-06 NOTE — Progress Notes (Addendum)
Fentanyl 233ml and versed 78ml wasted. Verified by 2nd RN Melinda Crutch.  Karin Lieu RN

## 2015-05-06 NOTE — Procedures (Signed)
Extubation Procedure Note  Patient Details:   Name: Levi Shepard DOB: March 16, 1941 MRN: [id number]   Airway Documentation:     Evaluation  O2 sats: stable throughout Complications: No apparent complications Patient did tolerate procedure well. Bilateral Breath Sounds: Clear Suctioning: Airway Yes   Pt. Was terminally extubated to RA with RN at the bedside without any complications.   Claretta Fraise 19-May-2015, 5:19 PM

## 2015-05-06 NOTE — Progress Notes (Addendum)
No spontaneous chest rise. Ausculation of heart x 66min. Rhythm noted asystole. Family present at bedside. Patient time of death occurred at 05-Jan-2143. Assessment verified by 2nd RN [employer]

## 2015-05-06 NOTE — Progress Notes (Addendum)
PULMONARY / CRITICAL CARE MEDICINE   Name: Levi Shepard MRN: [id number] DOB: Jul 28, 1941    ADMISSION DATE:  05/03/2015 CONSULTATION DATE:  04/21/2015  REFERRING MD :  Ricard Dillon  CHIEF COMPLAINT:  Short of breath  INITIAL PRESENTATION:  74 yo male with ascending aortic aneurysm and AI had Bentall procedure 7/13.  Developed slurred speech and Lt sided weakness post-op from Rt CVA.  Developed aspiration with respiratory failure and PCCM consulted.  STUDIES:  7/17 CT head >> massive infarct Rt cerebral hemisphere, edema, 9 mm Rt to Lt shift 7/18 head CT >> increased shift 15 mm  SIGNIFICANT EVENTS: 7/13 Bentall procedure 7/14 CVA, neuro consulted 7/16 Started 3% NS  7/17 Goals of care changed to limited resuscitation >> okay with short term intubation  SUBJECTIVE: afebrile Received fent overnight - not responsive this am Good UO   VITAL SIGNS: Temp:  [97.2 F (36.2 C)-99.8 F (37.7 C)] 99.5 F (37.5 C) (07/20 0741) Pulse Rate:  [70-91] 70 (07/20 0900) Resp:  [16-28] 22 (07/20 0900) BP: (104-160)/(58-96) 112/67 mmHg (07/20 0900) SpO2:  [96 %-100 %] 97 % (07/20 0900) FiO2 (%):  [40 %] 40 % (07/20 0842) Weight:  [88.8 kg (195 lb 12.3 oz)] 88.8 kg (195 lb 12.3 oz) (07/20 0500) HEMODYNAMICS:   VENTILATOR SETTINGS: Vent Mode:  [-] PRVC FiO2 (%):  [40 %] 40 % Set Rate:  [22 bmp] 22 bmp Vt Set:  [620 mL] 620 mL PEEP:  [5 cmH20] 5 cmH20 Pressure Support:  [10 cmH20] 10 cmH20 Plateau Pressure:  [14 cmH20-17 cmH20] 17 cmH20 INTAKE / OUTPUT:  Intake/Output Summary (Last 24 hours) at 05/03/2015 0937 Last data filed at May 03, 2015 0800  Gross per 24 hour  Intake   2640 ml  Output   3040 ml  Net   -400 ml    PHYSICAL EXAMINATION: General:acutely  ill appearing Neuro:  Hemiplegic on Lt side, RASS -3, unresponsive HEENT:  No jvd Cardiovascular:  regular Lungs:  B/l crackles Abdomen:  Soft, non tender Musculoskeletal:  No edema Skin:  No rashes  LABS:  CBC  Recent Labs Lab  04/22/15 0410 04/22/15 2221 03-May-2015 0421  WBC 8.3 8.2 8.1  HGB 10.1* 9.4* 10.0*  HCT 31.1* 28.8* 30.7*  PLT 164 195 263   Coag's  Recent Labs Lab 04/15/2015 1530  APTT 32  INR 1.53*   BMET  Recent Labs Lab 04/22/15 0410  04/22/15 2221  04/23/15 1635 04/23/15 2246 May 03, 2015 0421  NA 156*  < > 158*  < > 153* 152* 154*  K 3.1*  --  3.6  --   --   --  3.3*  CL 129*  --  127*  --   --   --  121*  CO2 23  --  24  --   --   --  24  BUN 23*  --  25*  --   --   --  25*  CREATININE 0.96  --  1.03  --   --   --  1.00  GLUCOSE 119*  --  104*  --   --   --  113*  < > = values in this interval not displayed.   Electrolytes  Recent Labs Lab 04/19/2015 2123 04/18/15 0450 04/18/15 1615  04/22/15 0410 04/22/15 2221 May 03, 2015 0421  CALCIUM  --  8.9  --   < > 9.4 9.0 9.1  MG 2.6* 2.1 1.9  --   --   --   --   < > =  values in this interval not displayed.   ABG  Recent Labs Lab 04/23/2015 1532 04/18/15 0335 04/21/15 1236  PHART 7.359 7.447 7.453*  PCO2ART 46.0* 32.5* 30.9*  PO2ART 69.0* 67.0* 209.0*   Liver Enzymes  Recent Labs Lab 04/22/15 0410  AST 50*  ALT 33  ALKPHOS 31*  BILITOT 0.8  ALBUMIN 2.4*    Glucose  Recent Labs Lab 04/23/15 1214 04/23/15 1606 04/23/15 1940 04/23/15 2347 05/17/15 0430 May 17, 2015 0739  GLUCAP 103* 115* 113* 145* 105* 120*    Imaging Dg Chest Port 1 View  May 17, 2015   CLINICAL DATA:  Intubation.  EXAM: PORTABLE CHEST - 1 VIEW  COMPARISON:  04/23/2015.  FINDINGS: Tracheostomy tube and feeding tube in stable position. Mediastinum hilar structures are stable. Prior median sternotomy. Stable cardiomegaly. Persistent bibasilar pulmonary alveolar infiltrates with small pleural effusions. Findings consistent with congestive heart failure. Bibasilar pneumonia cannot be excluded. Low lung volumes. No pneumothorax.  IMPRESSION: 1. Lines and tubes in stable position. 2. Prior median sternotomy.  Stable severe cardiomegaly. 3. Stent bilateral  pulmonary alveolar infiltrates with small pleural effusions. Findings consistent congestive heart failure. Bibasilar pneumonia cannot be excluded. No significant interim change from prior exam .   Electronically Signed   By: Marcello Moores  Register   On: 2015-05-17 07:23     ASSESSMENT / PLAN:  PULMONARY ETT 7/17 >> A: Compromised airway in setting of CVA and aspiration pneumonia. P:   No benefit of hyperventilation in this setting  CARDIOVASCULAR Rt IJ Introducer 7/13 >> A:  Aortic aneurysm s/p Bentall procedure. Hx of HTN, HLD. P:  Per TCTS BP control  RENAL A:   Medically induced hypernatremia - on 3% saline P:   Goal Na 150 to 155 per neurology Follow Na every 4-6 h  GASTROINTESTINAL A:   Nutrition. P:   Ct Tube feeds Protonix for SUP  HEMATOLOGIC A:   Anemia of critical illness. Mild thrombocytopenia. P:  F/u CBC Lovenox for DVT prevention  INFECTIOUS A:   Aspiration pneumonia. P:   7/17 >> vancomycin, zosyn >> 7/20 ceftx 7/20 >>  Sputum 7/17 >>klebs -pan S  ENDOCRINE A:   Hyperglycemia. P:   SSI  NEUROLOGIC A:   Acute CVA with cerebral edema & shift, likely embolic  P:   RASS goal 0 Neurology following Dc 3% saline  Summary - Massive Rt CVA post Bentall with intubation for  Aspiration. Guarded prognosis for neurologic recovery. Detailed d/w daughter Page [facility]) & other family members - she talked about his values & he would never want tubes etc & total care situation - he would prefer dying - had expressed this many times beofre the current situation & also prior to intubation. Will therefore proceed with vent withdrawal & comfort care when family ready - will make TCTs aware  The patient is critically ill with multiple organ systems failure and requires high complexity decision making for assessment and support, frequent evaluation and titration of therapies, application of advanced monitoring technologies and extensive interpretation of multiple  databases. Critical Care Time devoted to patient care services described in this note independent of APP time is 35 minutes.    Kara Mead MD. Shade Flood. [facility] Pager [phone] If no response call [phone]    2015-05-17, 9:37 AM

## 2015-05-06 NOTE — Progress Notes (Signed)
7 Days Post-Op Procedure(s) (LRB): BENTALL PROCEDURE (N/A) TRANSESOPHAGEAL ECHOCARDIOGRAM (TEE) (N/A) AORTIC VALVE REPLACEMENT (AVR) (N/A) Subjective: Intubated, sedated  Objective: Vital signs in last 24 hours: Temp:  [97.2 F (36.2 C)-99.8 F (37.7 C)] 99.5 F (37.5 C) (07/20 0741) Pulse Rate:  [88-91] 90 (07/20 0842) Cardiac Rhythm:  [-] Atrial paced (07/20 0609) Resp:  [16-28] 23 (07/20 0842) BP: (104-160)/(58-96) 126/79 mmHg (07/20 0842) SpO2:  [96 %-100 %] 96 % (07/20 0842) FiO2 (%):  [40 %] 40 % (07/20 0842) Weight:  [195 lb 12.3 oz (88.8 kg)] 195 lb 12.3 oz (88.8 kg) (07/20 0500)  Hemodynamic parameters for last 24 hours:    Intake/Output from previous day: 07/19 0701 - 07/20 0700 In: 2700 [I.V.:690; NG/GT:1510; IV Piggyback:500] Out: 2690 [Urine:2690] Intake/Output this shift: Total I/O In: -  Out: 350 [Urine:350]  General appearance: alert and no distress Neurologic: spontaneous movement of right arma nd leg Heart: regular rate and rhythm Lungs: diminished breath sounds left base Abdomen: normal findings: soft, non-tender  Lab Results:  Recent Labs  04/22/15 2221 05-13-15 0421  WBC 8.2 8.1  HGB 9.4* 10.0*  HCT 28.8* 30.7*  PLT 195 263   BMET:  Recent Labs  04/22/15 2221  04/23/15 2246 13-May-2015 0421  NA 158*  < > 152* 154*  K 3.6  --   --  3.3*  CL 127*  --   --  121*  CO2 24  --   --  24  GLUCOSE 104*  --   --  113*  BUN 25*  --   --  25*  CREATININE 1.03  --   --  1.00  CALCIUM 9.0  --   --  9.1  < > = values in this interval not displayed.  PT/INR: No results for input(s): LABPROT, INR in the last 72 hours. ABG    Component Value Date/Time   PHART 7.453* 04/21/2015 1236   HCO3 21.7 04/21/2015 1236   TCO2 23 04/21/2015 1236   ACIDBASEDEF 2.0 04/21/2015 1236   O2SAT 100.0 04/21/2015 1236   CBG (last 3)   Recent Labs  04/23/15 2347 13-May-2015 0430 2015/05/13 0739  GLUCAP 145* 105* 120*    Assessment/Plan: S/P Procedure(s)  (LRB): BENTALL PROCEDURE (N/A) TRANSESOPHAGEAL ECHOCARDIOGRAM (TEE) (N/A) AORTIC VALVE REPLACEMENT (AVR) (N/A) -  NEURO- no change in exam although is sedated and cannot fully evaluate  CV- stable BP  Has a narrow complex bradycardic rhythm under pacer  RESP- on vent, discussion this AM re: discontinuation of vent support  RENAL- hypernatremia medically induced  Hypokalemia- supplement  ENDO- CBG well controlled  Nutrition- on goal TF  Family meeting with Palliative care this AM   LOS: 7 days    Melrose Nakayama 05-13-15

## 2015-05-06 NOTE — Care Management Important Message (Signed)
Important Message  Patient Details  Name: Levi Shepard MRN: [id number] Date of Birth: [birth year]   Medicare Important Message Given:  Yes-fourth notification given    Nathen May 05/04/2015, 10:48 AMImportant Message  Patient Details  Name: Levi Shepard MRN: [id number] Date of Birth: Oct 31, 1940   Medicare Important Message Given:  Yes-fourth notification given    Nathen May 05-04-2015, 10:48 AM

## 2015-05-06 NOTE — Progress Notes (Signed)
Received call from RN that family had all arrived and have affirmed the desire for transition to full comfort care and removal of mechanical ventilation. I provided comfort and information on the compassionate wean/extubation process at the bedside and prepared family for next steps. Compassionate extubation orders placed and all current orders reconciled to reflect full comfort care and to allow for natural and peaceful death to occur. Anticipate rapid decline following extubation.Please call the palliative team for any EOL symptom management needs. Will use continuous infusion of fentanyl and versed with titration and bolus dosing for signs of discomfort. Our team will follow closely and support family in this difficult process- they are very courageous and are acting on behalf of Levi Shepard previously stated wishes regarding EOL care in the event of serious debility or terminal illness.  Levi Hacker, DO Palliative Medicine [phone]

## 2015-05-06 NOTE — Progress Notes (Signed)
Daily Progress Note   Patient Name: Levi Shepard       Date: 2015-05-01 DOB: 09/06/1941  Age: 74 y.o. MRN#: [id number] Attending Physician: Melrose Nakayama, MD Primary Care Physician: Myrtis Hopping, MD Admit Date: 04/26/2015  Reason for Consultation/Follow-up: Establishing goals of care  Subjective:     I met again today with family along with Dr. Elsworth Soho. Dr. Elsworth Soho very clearly laid out the options for 1) working towards recovery and what this would look like vs 2) we discussed extubation and comfort care and family are all very clear that he would not want to be on the vent, have trach, feeding tube. They say he has always been clear he would NEVER want a feeding tube or live in a nursing home. He would never want to be in a situation where he could not live independently and toilet himself. Given family's description of his values and lifestyle and what is acceptable I support a decision for comfort care with acknowledgement that he will likely die in days to weeks. Emotional support provided. Family agree and have decided on goal to extubate and focus on comfort - they are discussing a timeframe of when they would like to proceed.   Of note, family has decided to proceed with extubation today - likely ~4-5 pm after granddaughter arrives from [place]. Daughter, Arby Barrette Caryl Pina), confirms that this wish to proceed with full comfort care as she knows these are his wishes. I have received confirmation via Clare Charon, PA from Dr. Roxan Hockey to proceed if these are family wishes.   Length of Stay: 7 days  Current Medications: Scheduled Meds:  . antiseptic oral rinse  7 mL Mouth Rinse QID  . aspirin EC  325 mg Oral Daily   Or  . aspirin  324 mg Per Tube Daily  . bisacodyl  10 mg Rectal Daily  . chlorhexidine  15 mL Mouth Rinse BID  . docusate sodium  200 mg Oral Daily  . enoxaparin (LOVENOX) injection  30 mg Subcutaneous Q24H  . insulin aspart  0-24 Units Subcutaneous 6 times  per day  . insulin detemir  20 Units Subcutaneous Daily  . pantoprazole sodium  40 mg Per Tube Daily  . piperacillin-tazobactam (ZOSYN)  IV  3.375 g Intravenous 3 times per day  . potassium chloride  10 mEq Intravenous Q1 Hr x 2  . sodium chloride  3 mL Intravenous Q12H  . vancomycin  1,000 mg Intravenous Q8H    Continuous Infusions: . sodium chloride Stopped (04/23/15 1900)  . sodium chloride Stopped (04/23/15 0600)  . sodium chloride 10 mL/hr at 05-01-15 0600  . feeding supplement (VITAL AF 1.2 CAL) 1,000 mL (05-01-2015 0600)  . propofol (DIPRIVAN) infusion Stopped (04/23/15 1900)  . sodium chloride (hypertonic) 50 mL/hr at 05/01/2015 0600    PRN Meds: sodium chloride, fentaNYL (SUBLIMAZE) injection, glycopyrrolate, ondansetron (ZOFRAN) IV, sodium chloride  Palliative Performance Scale: 10% (sedated on vent)     Vital Signs: BP 112/67 mmHg  Pulse 70  Temp(Src) 99.5 F (37.5 C) (Axillary)  Resp 22  Ht 6' (1.829 m)  Wt 88.8 kg (195 lb 12.3 oz)  BMI 26.55 kg/m2  SpO2 97% SpO2: SpO2: 97 % O2 Device: O2 Device: Ventilator O2 Flow Rate: O2 Flow Rate (L/min): 5 L/min  Intake/output summary:  Intake/Output Summary (Last 24 hours) at May 01, 2015 0940 Last data filed at 05/01/2015 0800  Gross per 24 hour  Intake   2640 ml  Output   3040  ml  Net   -400 ml   LBM: Last BM Date: 04/23/15 Baseline Weight: Weight: 85.73 kg (189 lb) Most recent weight: Weight: 88.8 kg (195 lb 12.3 oz)  Physical Exam: General: NAD, intubated HEENT: ETT in place, moist mucous membranes CVS: Paced Resp: Mechanical ventilation, no labored breathing, initiating breathes Abd: Soft, NT, ND Extrem: Warm, dry Neuro: Intubated, spontaneous movement of right arm and right foot   Additional Data Reviewed: Recent Labs     04/22/15  2221   04/23/15  2246  May 10, 2015  0421  WBC  8.2   --    --   8.1  HGB  9.4*   --    --   10.0*  PLT  195   --    --   263  NA  158*   < >  152*  154*  BUN  25*   --    --    25*  CREATININE  1.03   --    --   1.00   < > = values in this interval not displayed.     Problem List:  Patient Active Problem List   Diagnosis Date Noted  . Palliative care encounter 04/23/2015  . Compromised airway   . Aspiration pneumonia   . Cytotoxic brain edema   . Cerebral embolism with cerebral infarction 04/19/2015  . Thoracic ascending aortic aneurysm 04/23/2015  . Aortic valve disorder   . Hypertension   . Aortic valve insufficiency   . LBP (low back pain) 12/19/2014  . Spondylolisthesis at L5-S1 level 12/19/2014  . 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 Anterior epistaxis 11/02/2013  . Ascending aortic aneurysm 10/28/2011  . Hyperlipidemia 10/28/2011  . Colon polyps 10/28/2011  . BPH (benign prostatic hypertrophy) 10/28/2011  . Asthma 10/28/2011  . Biceps tendon tear 10/28/2011     Palliative Care Assessment & Plan    Code Status:  DNR  Goals of Care:  Family wishes to proceed with full comfort care when all family arrives.   Desire for further Chaplaincy support: yes  3. Symptom Management:  Dyspnea/pain: Fentanyl infusion titrate to comfort to 50 mcg. Please utilize 50 mg bolus as needed to achieve comfort. Call for ineffective management and titrating of infusion/bolus.  Anxiety/seizure prohpylaxis: Give ativan 1-2 mg prior to extubation and every 4 hours prn.   Secretions: Robinul 0.4 mg every 4 hours prn.    5. Prognosis: Hours to days.   5. Discharge Planning: Hospital death vs hospice facility.    Care plan was discussed with Dr. Roxan Hockey, Dr. Elsworth Soho, RN.  Thank you for allowing the Palliative Medicine Team to assist in the care of this patient.  Total time: 44mn  Greater than 50%  of this time was spent counseling and coordinating care related to the above assessment and plan.     AVinie Sill NP Palliative Medicine Team Pager # [phone](M-F 8a-5p) Team Phone # [phone](Nights/Weekends)  7August 05, 2016  9:40 AM

## 2015-05-06 NOTE — Progress Notes (Signed)
STROKE TEAM PROGRESS NOTE   HISTORY Levi Shepard is a 74 y.o. 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 Upon waking this AM patient was noted to have a right gaze preference, inability to squeeze left hand, left facial droop and weakness of left leg. 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   Date last known well: Date: 04/18/2015 Time last known well: Unable to determine tPA Given: No: post op AO root replacement, unknown LKW Modified Rankin: Rankin Score=0  SUBJECTIVE (INTERVAL HISTORY) Levi Shepard is at the bedside. The patient still intubated. Spontaneous movement on the right. Palliative care has talked with family and they are ready for comfort care.    OBJECTIVE Temp:  [97.2 F (36.2 C)-99.8 F (37.7 C)] 99.5 F (37.5 C) (07/20 0741) Pulse Rate:  [70-91] 70 (07/20 1140) Cardiac Rhythm:  [-] Atrial paced (07/20 0800) Resp:  [17-28] 22 (07/20 1140) BP: (104-160)/(58-96) 138/80 mmHg (07/20 1140) SpO2:  [96 %-100 %] 97 % (07/20 1140) FiO2 (%):  [40 %] 40 % (07/20 1140) Weight:  [195 lb 12.3 oz (88.8 kg)] 195 lb 12.3 oz (88.8 kg) (07/20 0500)   Recent Labs Lab 04/23/15 1606 04/23/15 1940 04/23/15 2347 2015/05/18 0430 05/18/15 0739  GLUCAP 115* 113* 145* 105* 120*    Recent Labs Lab 05/02/2015 2123  04/18/15 0450 04/18/15 1615  04/20/15 0400  04/21/15 0412  04/22/15 0410  04/22/15 2221 04/23/15 0430 04/23/15 1100 04/23/15 1635 04/23/15 2246 2015/05/18 0421  NA  --   < > 137  --   < > 140  < > 149*  < > 156*  < > 158* 154* 156* 153* 152* 154*  K  --   < > 3.9  --   < > 3.0*  --  3.6  --  3.1*  --  3.6  --   --   --   --  3.3*  CL  --   < > 107  --   < > 109  --  121*  --  129*  --  127*  --   --   --   --  121*  CO2  --   --  22  --   < > 23  --  23  --  23  --  24  --    --   --   --  24  GLUCOSE  --   < > 154*  --   < > 125*  --  98  --  119*  --  104*  --   --   --   --  113*  BUN  --   < > 16  --   < > 21*  --  21*  --  23*  --  25*  --   --   --   --  25*  CREATININE 1.34*  < > 1.23 1.03  < > 0.93  --  0.77  --  0.96  --  1.03  --   --   --   --  1.00  CALCIUM  --   --  8.9  --   < > 9.1  --  9.0  --  9.4  --  9.0  --   --   --   --  9.1  MG 2.6*  --  2.1 1.9  --   --   --   --   --   --   --   --   --   --   --   --   --   < > = values in this interval not displayed.  Recent Labs Lab 04/22/15 0410  AST 50*  ALT 33  ALKPHOS 31*  BILITOT 0.8  PROT 5.5*  ALBUMIN 2.4*    Recent Labs Lab 04/20/15 0400 04/21/15 0412 04/22/15 0410 04/22/15 2221 May 18, 2015 0421  WBC 10.4 10.3 8.3 8.2 8.1  HGB 11.0* 10.9* 10.1* 9.4* 10.0*  HCT 32.1* 32.5* 31.1* 28.8* 30.7*  MCV 93.0 94.2 95.1 95.0 95.6  PLT 94* 141* 164 195 263   No results for input(s): CKTOTAL, CKMB, CKMBINDEX, TROPONINI in the last 168 hours. No results for input(s): LABPROT, INR in the last 72 hours. No results for input(s): COLORURINE, LABSPEC, [place], GLUCOSEU, HGBUR, BILIRUBINUR, KETONESUR, PROTEINUR, UROBILINOGEN, NITRITE, LEUKOCYTESUR in the last 72 hours.  Invalid input(s): APPERANCEUR     Component Value Date/Time   CHOL 90 04/20/2015 0400   TRIG 93 04/21/2015 1245   HDL 22* 04/20/2015 0400   CHOLHDL 4.1 04/20/2015 0400   VLDL 22 04/20/2015 0400   LDLCALC 46 04/20/2015 0400   Lab Results  Component Value Date   HGBA1C 5.8* 04/11/2015   No results found for: LABOPIA, COCAINSCRNUR, LABBENZ, AMPHETMU, THCU, LABBARB  No results for input(s): ETH in the last 168 hours.   Imaging I have personally reviewed the radiological images below and agree with the radiology interpretations.  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     04/21/15 1. Similar appearance of subacute infarct involving much  of the right cerebral hemisphere in the distribution of the right middle cerebral artery. 2. Stable edema and mass-effect with right to left midline shift measuring 9 mm.  04/23/15 Evolving subacute large RIGHT middle cerebral artery territory infarct without further propagation. Linear areas of petechial hemorrhage, and possible cortical venous stasis versus subarachnoid blood at the RIGHT frontal convexity without lobar hematoma. Increasing mass effect resulting in 15 mm RIGHT to LEFT midline shift, previously 9 mm without ventricular entrapment.  CT angio brain 04/20/15 : 1. Occlusion of the distal right MCA M1 segment with moderate to poor right MCA territory collateral flow. 2. Evolved complete right MCA territory infarct with increased cytotoxic edema and mass effect. Leftward midline shift now 8 mm. No associated hemorrhage.  TTE 04/01/15 - - Left ventricle: The cavity size was normal. There was moderate concentric hypertrophy. Systolic function was normal. The estimated ejection fraction was in the range of 50% to 55%. Wall motion was normal; there were no regional wall motion abnormalities. Due to first degree atrioventricular block, there was fusion of early and atrial contributions to ventricular filling. - Ventricular septum: Septal motion showed paradox. - Aortic valve: Valve area (VTI): 3.56 cm^2. Valve area (Vmean): 3.33 cm^2. - Aortic root: The aortic root was moderately dilated. - Ascending aorta: The ascending aorta was severely dilated. - Mitral valve: There was mild to moderate regurgitation directed centrally. - Left atrium: The atrium was mildly dilated.  CUS - Bilateral: 1-39% ICA stenosis. Vertebral artery flow is antegrade.  PHYSICAL EXAM   Temp:  [97.2 F (36.2 C)-99.8 F (37.7 C)] 99.5 F (37.5 C) (07/20 0741) Pulse Rate:  [70-91] 70 (07/20 1140) Resp:  [  17-28] 22 (07/20 1140) BP: (104-160)/(58-96) 138/80 mmHg (07/20 1140) SpO2:  [96  %-100 %] 97 % (07/20 1140) FiO2 (%):  [40 %] 40 % (07/20 1140) Weight:  [195 lb 12.3 oz (88.8 kg)] 195 lb 12.3 oz (88.8 kg) (07/20 0500)  General - Well nourished, well developed, intubated off sedation.  Ophthalmologic - Fundi not visualized due to small pupils.  Cardiovascular - Regular rate and rhythm with no murmur.  Neuro - intubated off sedation, very lethargic, on voice stimulation, he briefly open eyes, able to follow central and peripheral commands. Eyes deviated to the right, left neglect, not blinking to visual threat. PERRL, left facial droop, LUE 0/5, LLE 0/5 with pain stimulation 1/5. RUE spontaneous movement, RLE mild withdraw with pain stimulation. sensation, coordination and gait not tested.   ASSESSMENT/PLAN Levi Shepard is a 74 y.o. 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  He did not receive IV t-PA due to recent surgery and unknown time of onset.  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  Repeat CT head showed increasing midline shift now 59mm with small hemorrhagic transformation vs. Venous statsis on the right.    CTA- Occluded distal right M1  Carotid Doppler unremarkable.   2D Echo - EF 50-55%. No cardiac source of emboli identified.  LDL 46  HgbA1c 5.8 Diet NPO time specified  Had long discussion with pt family yesterday and they requested palliative care. Overnight, palliative care involved and pt family is ready for comfort care per pt wishes.  Cerebral edema  Intubated   On 3% saline   Repeat CT showed increasing  midline shift and small hemorrhagic transformation vs. Venous stasis  Had a long discussion with daughter and she firmly stated that her dad is such an active person, has expressed in the past not to accept the condition of living in nursing home with no quality of life. palliative care involved and family is ready for comfort care.  Hypertension  Home meds:  No antihypertensives medications prior to admission  Stable  Hyperlipidemia  Home meds:  Fenofibrate and lovastatin  LDL 46, goal < 70  Other Stroke Risk Factors  Advanced age  ETOH use  Recent aortic root dissection and subsequent replacement  Other Active Problems  Mild anemia  Aspiration peumonia - on zosyn and vancomycin  Other Pertinent History  Intubated off sedation  Hospital day # 7  Neurology will sign off. Please call with questions. Thanks for the consult.  Rosalin Hawking, MD PhD Stroke Neurology 05/13/2015 1:14 PM    To contact Stroke Continuity provider, please refer to [email or web address] After hours, contact General Neurology

## 2015-05-06 DEATH — deceased

## 2015-07-04 ENCOUNTER — Ambulatory Visit: Payer: Medicare Other | Admitting: Cardiology

## 2017-05-10 IMAGING — CT CT ANGIO CHEST
2 of 5 series · 8 of 30 positions shown · IV contrast (75CC ISOVUE 370)
Comparison: CT scan of May 23, 2014.

CLINICAL DATA: Ascending thoracic aortic aneurysm.

EXAM:
CT ANGIOGRAPHY CHEST WITH CONTRAST
TECHNIQUE: Multidetector CT imaging of the chest was performed using the
standard protocol during bolus administration of intravenous
contrast. Multiplanar CT image reconstructions and MIPs were
obtained to evaluate the vascular anatomy.
CONTRAST:  75 mL of Isovue 370 intravenously.

[Series 4: angio · axial · 0.74mm/px · z∈[-247,-92]mm · 3 of 126 slices shown]
[im 32/126  lung]
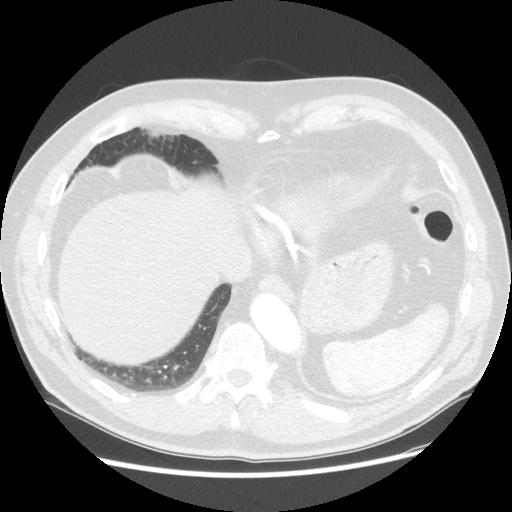
[im 63/126  mediastinal]
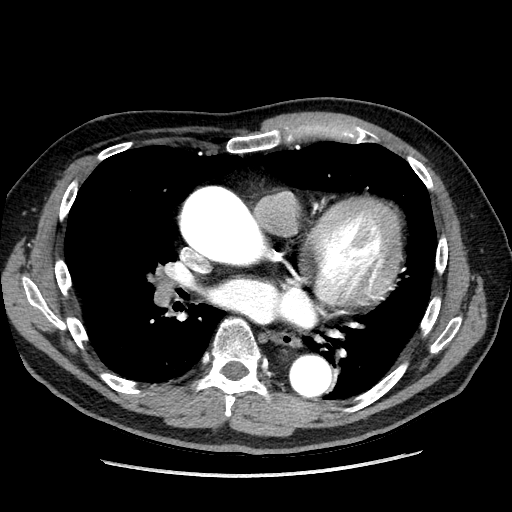
[im 94/126  lung]
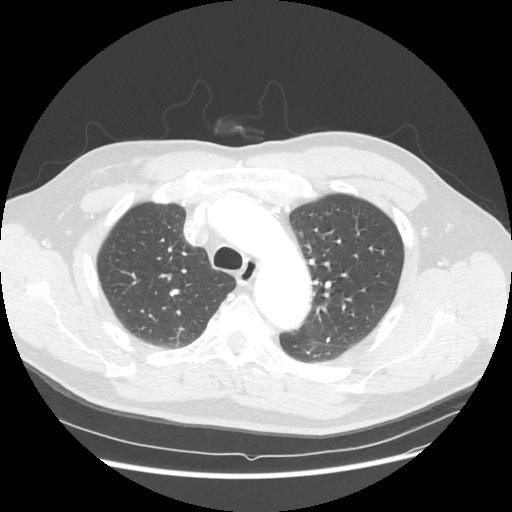

[Series 602: sagittal body · sagittal · 0.74mm/px · 5 of 153 slices shown]
[im 26/153  lung]
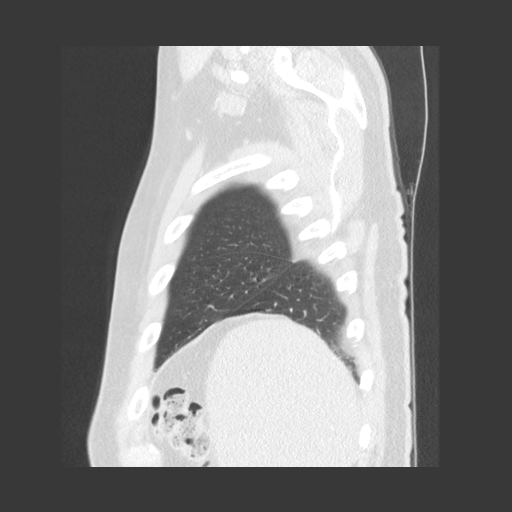
[im 51/153  lung]
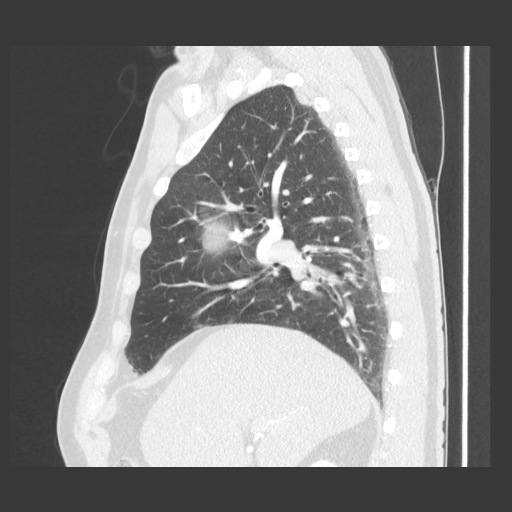
[im 77/153  lung]
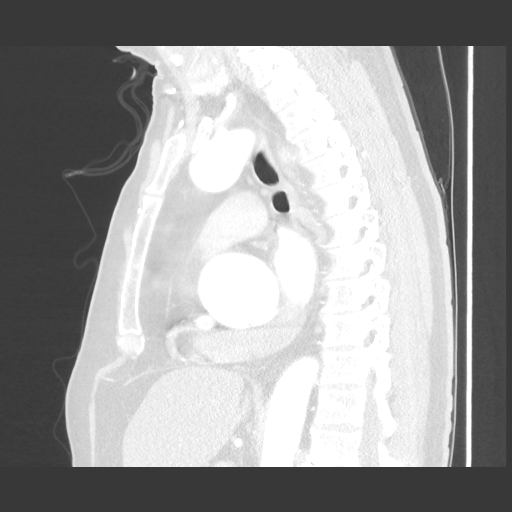
[im 102/153  lung]
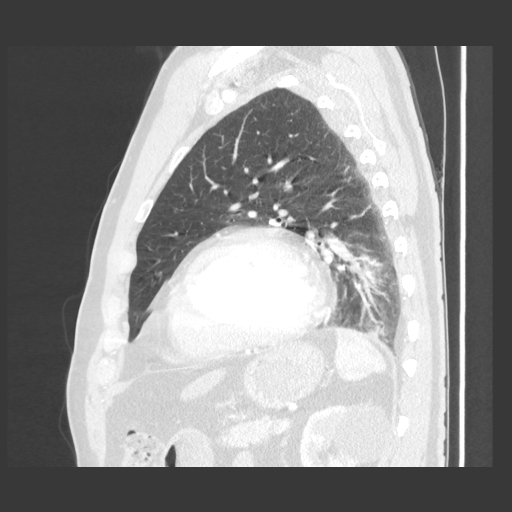
[im 127/153  lung]
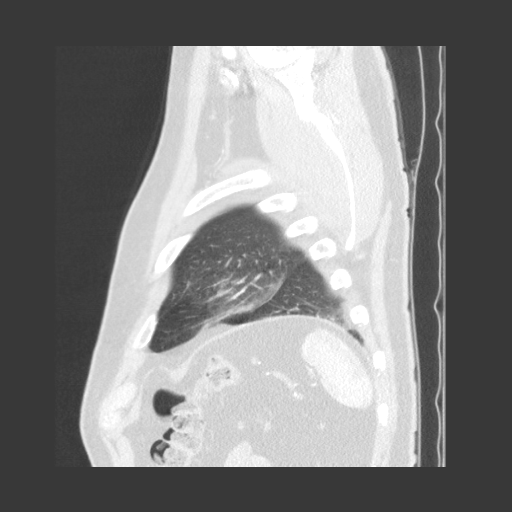

[8 of 30 positions shown; findings below may reference images not displayed]

FINDINGS: No pneumothorax or pleural effusion is noted. Coronary artery
calcifications are again noted. Stable cardiomegaly is noted. Stable
left renal cyst is noted ; otherwise visualized portion of upper
abdomen appears normal. There is no evidence of thoracic aortic
dissection. Great vessels are widely patent without significant
stenosis.

[place] junction of ascending thoracic aorta measures 5.2 cm in
diameter which is unchanged compared to prior exam. Diameter at
aortic valve measures 2.8 cm which is unchanged compared to prior
exam. At the sinuses of Valsalva, maximum measured diameter 5.1 cm
is noted which is increased compared to prior exam. Diameter just
above the Ramzi Med junction measures 5.6 cm which is increased
compared to prior exam. Diameter of distal ascending thoracic aorta
is 4.2 cm which is increased compared to prior exam. Aortic arch
measures 3.5 cm in diameter which is slightly increased compared to
prior exam. Proximal portion of descending thoracic aorta measures
4.0 cm which is increased compared to prior exam. Distal portion of
descending thoracic aorta measures 3 cm 3.0 cm which is not
significantly changed compared to prior exam.

No acute pulmonary disease is noted. No mediastinal mass or
adenopathy is noted.

Review of the MIP images confirms the above findings.
IMPRESSION: Mild aneurysmal dilatation of the ascending thoracic aorta is noted,
measuring 5.6 cm just above the Ramzi Med junction which is
increased compared to prior exam. Cardiothoracic surgery
consultation recommended due to increased risk of rupture for arch
aneurysm ? 5.5 cm. This recommendation follows 4979
ACCF/AHA/AATS/ACR/ASA/SCA/[place]/MAJSTER/SARRIA/LAZRAK Guidelines for the
Diagnosis and Management of Patients With Thoracic Aortic Disease.
Circulation. 4979; 121: [id number]
